# Patient Record
Sex: Female | Born: 1960 | Race: White | Hispanic: No | Marital: Married | Smoking: Never smoker
Health system: Southern US, Community
[De-identification: ages and names within clinical notes are randomized; demographics above are authoritative.]

## PROBLEM LIST (undated history)

## (undated) DIAGNOSIS — J302 Other seasonal allergic rhinitis: Secondary | ICD-10-CM

## (undated) DIAGNOSIS — E785 Hyperlipidemia, unspecified: Secondary | ICD-10-CM

## (undated) DIAGNOSIS — C801 Malignant (primary) neoplasm, unspecified: Secondary | ICD-10-CM

## (undated) DIAGNOSIS — I10 Essential (primary) hypertension: Secondary | ICD-10-CM

## (undated) DIAGNOSIS — K219 Gastro-esophageal reflux disease without esophagitis: Secondary | ICD-10-CM

## (undated) DIAGNOSIS — H11001 Unspecified pterygium of right eye: Secondary | ICD-10-CM

## (undated) HISTORY — PX: ABDOMINAL HYSTERECTOMY: SHX81

## (undated) HISTORY — PX: CHOLECYSTECTOMY: SHX55

## (undated) HISTORY — DX: Essential (primary) hypertension: I10

## (undated) HISTORY — PX: INGUINAL HERNIA REPAIR: SUR1180

## (undated) HISTORY — DX: Gastro-esophageal reflux disease without esophagitis: K21.9

## (undated) HISTORY — DX: Unspecified pterygium of right eye: H11.001

## (undated) HISTORY — DX: Hyperlipidemia, unspecified: E78.5

## (undated) HISTORY — DX: Other seasonal allergic rhinitis: J30.2

---

## 1997-10-01 HISTORY — PX: LAPAROSCOPIC CHOLECYSTECTOMY W/ CHOLANGIOGRAPHY: SUR757

## 2001-10-01 HISTORY — PX: OTHER SURGICAL HISTORY: SHX169

## 2005-02-26 ENCOUNTER — Ambulatory Visit: Payer: Self-pay | Admitting: Internal Medicine

## 2006-05-17 ENCOUNTER — Ambulatory Visit: Payer: Self-pay | Admitting: Internal Medicine

## 2006-06-12 ENCOUNTER — Ambulatory Visit: Payer: Self-pay | Admitting: Gastroenterology

## 2006-06-28 ENCOUNTER — Ambulatory Visit: Payer: Self-pay | Admitting: Gastroenterology

## 2006-07-24 ENCOUNTER — Ambulatory Visit: Payer: Self-pay | Admitting: Gastroenterology

## 2006-07-30 ENCOUNTER — Other Ambulatory Visit: Payer: Self-pay

## 2006-08-05 ENCOUNTER — Ambulatory Visit: Payer: Self-pay | Admitting: Obstetrics and Gynecology

## 2007-07-08 ENCOUNTER — Ambulatory Visit: Payer: Self-pay | Admitting: Internal Medicine

## 2008-06-21 ENCOUNTER — Ambulatory Visit: Payer: Self-pay

## 2008-06-24 ENCOUNTER — Ambulatory Visit: Payer: Self-pay | Admitting: General Practice

## 2008-08-31 ENCOUNTER — Ambulatory Visit: Payer: Self-pay | Admitting: Internal Medicine

## 2008-09-08 ENCOUNTER — Ambulatory Visit: Payer: Self-pay | Admitting: Internal Medicine

## 2008-09-15 ENCOUNTER — Ambulatory Visit: Payer: Self-pay | Admitting: Surgery

## 2008-09-22 ENCOUNTER — Ambulatory Visit: Payer: Self-pay | Admitting: Surgery

## 2008-09-22 DIAGNOSIS — C801 Malignant (primary) neoplasm, unspecified: Secondary | ICD-10-CM

## 2008-09-22 HISTORY — PX: BREAST EXCISIONAL BIOPSY: SUR124

## 2008-09-22 HISTORY — DX: Malignant (primary) neoplasm, unspecified: C80.1

## 2008-10-18 ENCOUNTER — Ambulatory Visit: Payer: Self-pay | Admitting: Surgery

## 2008-11-01 ENCOUNTER — Ambulatory Visit: Payer: Self-pay | Admitting: Oncology

## 2008-11-03 ENCOUNTER — Ambulatory Visit: Payer: Self-pay | Admitting: Oncology

## 2008-11-29 ENCOUNTER — Ambulatory Visit: Payer: Self-pay | Admitting: Oncology

## 2009-01-27 ENCOUNTER — Ambulatory Visit: Payer: Self-pay

## 2009-02-03 ENCOUNTER — Inpatient Hospital Stay: Payer: Self-pay

## 2009-02-03 HISTORY — PX: MASTECTOMY: SHX3

## 2009-02-03 HISTORY — PX: AUGMENTATION MAMMAPLASTY: SUR837

## 2009-03-01 ENCOUNTER — Ambulatory Visit: Payer: Self-pay | Admitting: Oncology

## 2009-03-23 ENCOUNTER — Ambulatory Visit: Payer: Self-pay | Admitting: Oncology

## 2009-03-31 ENCOUNTER — Ambulatory Visit: Payer: Self-pay | Admitting: Oncology

## 2009-08-31 ENCOUNTER — Ambulatory Visit: Payer: Self-pay | Admitting: Oncology

## 2009-09-07 ENCOUNTER — Ambulatory Visit: Payer: Self-pay

## 2009-09-08 ENCOUNTER — Ambulatory Visit: Payer: Self-pay

## 2009-09-19 ENCOUNTER — Ambulatory Visit: Payer: Self-pay | Admitting: Oncology

## 2009-10-01 ENCOUNTER — Ambulatory Visit: Payer: Self-pay | Admitting: Oncology

## 2009-11-17 ENCOUNTER — Ambulatory Visit: Payer: Self-pay | Admitting: Internal Medicine

## 2010-03-01 ENCOUNTER — Ambulatory Visit: Payer: Self-pay | Admitting: Oncology

## 2010-03-22 ENCOUNTER — Ambulatory Visit: Payer: Self-pay | Admitting: Oncology

## 2010-03-31 ENCOUNTER — Ambulatory Visit: Payer: Self-pay | Admitting: Oncology

## 2010-09-22 ENCOUNTER — Ambulatory Visit: Payer: Self-pay | Admitting: Oncology

## 2010-10-01 ENCOUNTER — Ambulatory Visit: Payer: Self-pay | Admitting: Oncology

## 2010-11-22 ENCOUNTER — Ambulatory Visit: Payer: Self-pay | Admitting: Internal Medicine

## 2011-04-02 ENCOUNTER — Ambulatory Visit: Payer: Self-pay | Admitting: Oncology

## 2011-05-02 ENCOUNTER — Ambulatory Visit: Payer: Self-pay | Admitting: Oncology

## 2011-10-02 HISTORY — PX: COLONOSCOPY: SHX174

## 2011-11-27 ENCOUNTER — Ambulatory Visit: Payer: Self-pay | Admitting: Internal Medicine

## 2011-12-24 ENCOUNTER — Ambulatory Visit: Payer: Self-pay | Admitting: Oncology

## 2011-12-24 LAB — COMPREHENSIVE METABOLIC PANEL
Anion Gap: 10 (ref 7–16)
Bilirubin,Total: 0.2 mg/dL (ref 0.2–1.0)
Calcium, Total: 8.9 mg/dL (ref 8.5–10.1)
Chloride: 101 mmol/L (ref 98–107)
Co2: 29 mmol/L (ref 21–32)
Creatinine: 1.04 mg/dL (ref 0.60–1.30)
EGFR (African American): >60
EGFR (Non-African Amer.): 59 — ABNORMAL LOW
Glucose: 110 mg/dL — ABNORMAL HIGH (ref 65–99)
Osmolality: 282 (ref 275–301)
Potassium: 3.4 mmol/L — ABNORMAL LOW (ref 3.5–5.1)
SGOT(AST): 16 U/L (ref 15–37)
SGPT (ALT): 28 U/L

## 2011-12-24 LAB — CBC CANCER CENTER
Basophil #: 0 x10 3/mm (ref 0.0–0.1)
Basophil %: 0.5 %
HGB: 12.9 g/dL (ref 12.0–16.0)
Lymphocyte #: 2.5 x10 3/mm (ref 1.0–3.6)
MCHC: 34.1 g/dL (ref 32.0–36.0)
MCV: 89 fL (ref 80–100)
Monocyte %: 8.2 %
Neutrophil #: 4.4 x10 3/mm (ref 1.4–6.5)
Neutrophil %: 56.7 %
Platelet: 379 x10 3/mm (ref 150–440)
RBC: 4.25 10*6/uL (ref 3.80–5.20)
WBC: 7.8 x10 3/mm (ref 3.6–11.0)

## 2011-12-25 LAB — CANCER ANTIGEN 27.29: CA 27.29: 18.7 U/mL (ref 0.0–38.6)

## 2011-12-31 ENCOUNTER — Ambulatory Visit: Payer: Self-pay | Admitting: Oncology

## 2012-01-30 ENCOUNTER — Ambulatory Visit: Payer: Self-pay | Admitting: Oncology

## 2012-06-25 ENCOUNTER — Ambulatory Visit: Payer: Self-pay | Admitting: Oncology

## 2012-06-25 LAB — CBC CANCER CENTER
Basophil #: 0.1 x10 3/mm (ref 0.0–0.1)
Eosinophil %: 2.3 %
HCT: 37.4 % (ref 35.0–47.0)
HGB: 12.2 g/dL (ref 12.0–16.0)
Lymphocyte #: 2.3 x10 3/mm (ref 1.0–3.6)
MCH: 29.6 pg (ref 26.0–34.0)
MCV: 90 fL (ref 80–100)
Monocyte #: 0.7 x10 3/mm (ref 0.2–0.9)
Platelet: 338 x10 3/mm (ref 150–440)
RDW: 13.1 % (ref 11.5–14.5)
WBC: 8.6 x10 3/mm (ref 3.6–11.0)

## 2012-06-25 LAB — COMPREHENSIVE METABOLIC PANEL
Albumin: 3.9 g/dL (ref 3.4–5.0)
Alkaline Phosphatase: 58 U/L (ref 50–136)
Anion Gap: 11 (ref 7–16)
Bilirubin,Total: 0.2 mg/dL (ref 0.2–1.0)
Calcium, Total: 9.3 mg/dL (ref 8.5–10.1)
Chloride: 99 mmol/L (ref 98–107)
EGFR (African American): 57 — ABNORMAL LOW
Osmolality: 278 (ref 275–301)
Potassium: 3.6 mmol/L (ref 3.5–5.1)
SGOT(AST): 17 U/L (ref 15–37)
SGPT (ALT): 25 U/L (ref 12–78)
Sodium: 138 mmol/L (ref 136–145)

## 2012-06-26 LAB — CANCER ANTIGEN 27.29: CA 27.29: 14.3 U/mL (ref 0.0–38.6)

## 2012-07-01 ENCOUNTER — Ambulatory Visit: Payer: Self-pay | Admitting: Oncology

## 2012-07-25 ENCOUNTER — Ambulatory Visit: Payer: Self-pay | Admitting: Gastroenterology

## 2012-07-28 LAB — PATHOLOGY REPORT

## 2012-08-01 ENCOUNTER — Ambulatory Visit: Payer: Self-pay | Admitting: Oncology

## 2012-09-09 ENCOUNTER — Ambulatory Visit: Payer: Self-pay | Admitting: Anesthesiology

## 2012-09-09 LAB — POTASSIUM: Potassium: 4 mmol/L (ref 3.5–5.1)

## 2012-09-15 ENCOUNTER — Ambulatory Visit: Payer: Self-pay | Admitting: Surgery

## 2012-11-27 ENCOUNTER — Ambulatory Visit: Payer: Self-pay | Admitting: Oncology

## 2012-12-22 ENCOUNTER — Ambulatory Visit: Payer: Self-pay | Admitting: Oncology

## 2013-01-07 ENCOUNTER — Ambulatory Visit: Payer: Self-pay | Admitting: Oncology

## 2013-01-14 LAB — COMPREHENSIVE METABOLIC PANEL
Albumin: 3.3 g/dL — ABNORMAL LOW (ref 3.4–5.0)
Alkaline Phosphatase: 51 U/L (ref 50–136)
Anion Gap: 7 (ref 7–16)
BUN: 12 mg/dL (ref 7–18)
Co2: 27 mmol/L (ref 21–32)
Creatinine: 0.84 mg/dL (ref 0.60–1.30)
EGFR (African American): >60
Glucose: 114 mg/dL — ABNORMAL HIGH (ref 65–99)
Osmolality: 278 (ref 275–301)
SGPT (ALT): 24 U/L (ref 12–78)
Sodium: 139 mmol/L (ref 136–145)

## 2013-01-14 LAB — CBC CANCER CENTER
Basophil #: 0.1 x10 3/mm (ref 0.0–0.1)
Basophil %: 1.2 %
Eosinophil %: 2.1 %
HGB: 12.1 g/dL (ref 12.0–16.0)
Lymphocyte %: 38.6 %
MCH: 29.1 pg (ref 26.0–34.0)
Monocyte #: 0.7 x10 3/mm (ref 0.2–0.9)
Neutrophil %: 49.2 %
Platelet: 353 x10 3/mm (ref 150–440)
RBC: 4.15 10*6/uL (ref 3.80–5.20)
RDW: 13.9 % (ref 11.5–14.5)
WBC: 8 x10 3/mm (ref 3.6–11.0)

## 2013-01-29 ENCOUNTER — Ambulatory Visit: Payer: Self-pay | Admitting: Oncology

## 2013-05-07 ENCOUNTER — Ambulatory Visit: Payer: Self-pay | Admitting: Surgery

## 2013-07-15 ENCOUNTER — Ambulatory Visit: Payer: Self-pay | Admitting: Oncology

## 2013-07-15 LAB — CBC CANCER CENTER
Basophil #: 0.1 10*3/uL
Basophil %: 1.4 %
Eosinophil #: 0.2 10*3/uL
Eosinophil %: 2.6 %
HCT: 36.4 %
HGB: 12.5 g/dL
Lymphocyte %: 25.9 %
Lymphs Abs: 1.9 10*3/uL
MCH: 30.4 pg
MCHC: 34.3 g/dL
MCV: 89 fL
Monocyte #: 0.7 10*3/uL
Monocyte %: 9.3 %
Neutrophil #: 4.4 10*3/uL
Neutrophil %: 60.8 %
Platelet: 352 10*3/uL
RBC: 4.11 10*6/uL
RDW: 13.4 %
WBC: 7.2 10*3/uL

## 2013-07-15 LAB — COMPREHENSIVE METABOLIC PANEL WITH GFR
Albumin: 3.6 g/dL
Alkaline Phosphatase: 64 U/L
Anion Gap: 12
BUN: 14 mg/dL
Bilirubin,Total: 0.3 mg/dL
Calcium, Total: 8.5 mg/dL
Chloride: 101 mmol/L
Co2: 25 mmol/L
Creatinine: 1.11 mg/dL
EGFR (African American): >60
EGFR (Non-African Amer.): 57 — ABNORMAL LOW
Glucose: 174 mg/dL — ABNORMAL HIGH
Osmolality: 280
Potassium: 3.5 mmol/L
SGOT(AST): 18 U/L
SGPT (ALT): 39 U/L
Sodium: 138 mmol/L
Total Protein: 7.2 g/dL

## 2013-07-16 LAB — CANCER ANTIGEN 27.29: CA 27.29: 17.4 U/mL (ref 0.0–38.6)

## 2013-08-01 ENCOUNTER — Ambulatory Visit: Payer: Self-pay | Admitting: Oncology

## 2013-08-17 HISTORY — PX: LAPAROSCOPIC GASTRIC BYPASS: SUR771

## 2013-11-03 DIAGNOSIS — K219 Gastro-esophageal reflux disease without esophagitis: Secondary | ICD-10-CM | POA: Insufficient documentation

## 2013-11-03 DIAGNOSIS — E782 Mixed hyperlipidemia: Secondary | ICD-10-CM | POA: Insufficient documentation

## 2013-11-03 DIAGNOSIS — Z853 Personal history of malignant neoplasm of breast: Secondary | ICD-10-CM | POA: Insufficient documentation

## 2013-11-03 DIAGNOSIS — I10 Essential (primary) hypertension: Secondary | ICD-10-CM | POA: Insufficient documentation

## 2014-01-13 ENCOUNTER — Ambulatory Visit: Payer: Self-pay | Admitting: Oncology

## 2014-01-13 LAB — CBC CANCER CENTER
BASOS ABS: 0.1 x10 3/mm (ref 0.0–0.1)
Basophil %: 1 %
Eosinophil #: 0.3 x10 3/mm (ref 0.0–0.7)
Eosinophil %: 3.8 %
HCT: 40.3 % (ref 35.0–47.0)
HGB: 13.1 g/dL (ref 12.0–16.0)
LYMPHS ABS: 2 x10 3/mm (ref 1.0–3.6)
Lymphocyte %: 29.8 %
MCH: 30.1 pg (ref 26.0–34.0)
MCHC: 32.4 g/dL (ref 32.0–36.0)
MCV: 93 fL (ref 80–100)
Monocyte #: 0.5 x10 3/mm (ref 0.2–0.9)
Monocyte %: 7.6 %
NEUTROS ABS: 3.9 x10 3/mm (ref 1.4–6.5)
NEUTROS PCT: 57.8 %
PLATELETS: 321 x10 3/mm (ref 150–440)
RBC: 4.34 10*6/uL (ref 3.80–5.20)
RDW: 13.6 % (ref 11.5–14.5)
WBC: 6.7 x10 3/mm (ref 3.6–11.0)

## 2014-01-13 LAB — COMPREHENSIVE METABOLIC PANEL
Albumin: 3.7 g/dL (ref 3.4–5.0)
Alkaline Phosphatase: 69 U/L
Anion Gap: 8 (ref 7–16)
BILIRUBIN TOTAL: 0.2 mg/dL (ref 0.2–1.0)
BUN: 20 mg/dL — AB (ref 7–18)
Calcium, Total: 9.7 mg/dL (ref 8.5–10.1)
Chloride: 103 mmol/L (ref 98–107)
Co2: 31 mmol/L (ref 21–32)
Creatinine: 0.74 mg/dL (ref 0.60–1.30)
Glucose: 99 mg/dL (ref 65–99)
Osmolality: 286 (ref 275–301)
POTASSIUM: 4.1 mmol/L (ref 3.5–5.1)
SGOT(AST): 18 U/L (ref 15–37)
SGPT (ALT): 34 U/L (ref 12–78)
Sodium: 142 mmol/L (ref 136–145)
TOTAL PROTEIN: 7.1 g/dL (ref 6.4–8.2)

## 2014-01-29 ENCOUNTER — Ambulatory Visit: Payer: Self-pay | Admitting: Oncology

## 2014-02-05 ENCOUNTER — Ambulatory Visit: Payer: Self-pay | Admitting: Oncology

## 2014-05-11 ENCOUNTER — Ambulatory Visit: Payer: Self-pay | Admitting: Surgery

## 2014-07-15 ENCOUNTER — Ambulatory Visit: Payer: Self-pay | Admitting: Oncology

## 2014-07-15 LAB — CBC CANCER CENTER
BASOS ABS: 0.1 x10 3/mm (ref 0.0–0.1)
Basophil %: 1.1 %
EOS ABS: 0.1 x10 3/mm (ref 0.0–0.7)
Eosinophil %: 1.6 %
HCT: 39.7 % (ref 35.0–47.0)
HGB: 13.1 g/dL (ref 12.0–16.0)
LYMPHS PCT: 32.1 %
Lymphocyte #: 2 x10 3/mm (ref 1.0–3.6)
MCH: 31.1 pg (ref 26.0–34.0)
MCHC: 32.9 g/dL (ref 32.0–36.0)
MCV: 95 fL (ref 80–100)
MONOS PCT: 8.1 %
Monocyte #: 0.5 x10 3/mm (ref 0.2–0.9)
NEUTROS PCT: 57.1 %
Neutrophil #: 3.6 x10 3/mm (ref 1.4–6.5)
Platelet: 319 x10 3/mm (ref 150–440)
RBC: 4.2 10*6/uL (ref 3.80–5.20)
RDW: 13.5 % (ref 11.5–14.5)
WBC: 6.4 x10 3/mm (ref 3.6–11.0)

## 2014-07-15 LAB — COMPREHENSIVE METABOLIC PANEL
ALBUMIN: 3.8 g/dL (ref 3.4–5.0)
ALK PHOS: 75 U/L
ANION GAP: 6 — AB (ref 7–16)
BILIRUBIN TOTAL: 0.3 mg/dL (ref 0.2–1.0)
BUN: 18 mg/dL (ref 7–18)
CALCIUM: 9.1 mg/dL (ref 8.5–10.1)
Chloride: 104 mmol/L (ref 98–107)
Co2: 30 mmol/L (ref 21–32)
Creatinine: 0.81 mg/dL (ref 0.60–1.30)
EGFR (African American): >60
EGFR (Non-African Amer.): >60
GLUCOSE: 80 mg/dL (ref 65–99)
Osmolality: 280 (ref 275–301)
Potassium: 4.4 mmol/L (ref 3.5–5.1)
SGOT(AST): 21 U/L (ref 15–37)
SGPT (ALT): 39 U/L
Sodium: 140 mmol/L (ref 136–145)
Total Protein: 7 g/dL (ref 6.4–8.2)

## 2014-07-16 LAB — CANCER ANTIGEN 27.29: CA 27.29: 5.2 U/mL (ref 0.0–38.6)

## 2014-08-01 ENCOUNTER — Ambulatory Visit: Payer: Self-pay | Admitting: Oncology

## 2015-01-14 ENCOUNTER — Other Ambulatory Visit: Payer: Self-pay | Admitting: Oncology

## 2015-01-14 DIAGNOSIS — Z1231 Encounter for screening mammogram for malignant neoplasm of breast: Secondary | ICD-10-CM

## 2015-01-18 NOTE — Op Note (Signed)
PATIENT NAME:  Jessica Hammond, Jessica Hammond MR#:  [id number] DATE OF BIRTH:  18-Dec-1960  DATE OF PROCEDURE:  09/15/2012  PREOPERATIVE DIAGNOSIS: Right inguinal hernia.   POSTOPERATIVE DIAGNOSIS: Right inguinal hernia.   PROCEDURE: Right inguinal hernia repair.   SURGEON: Rochel Brome, MD   ANESTHESIA: General.   INDICATIONS: This 54 year old female recently has had right lower abdominal pain, CT findings of right inguinal hernia containing small bowel. She had a history of a TRAM flap, and a bulge was demonstrated on physical exam which extended down into the mons pubis.   DESCRIPTION OF PROCEDURE: The patient was placed on the operating table in the supine position under general anesthesia. The right lower abdomen was prepared with ChloraPrep and draped in a sterile manner.   The hernia was reduced.  A transversely-oriented right suprapubic incision was made at the site of the old scar, carried down through subcutaneous tissues. Several small bleeding points were cauterized Scarpa's fascia was incised. There was scarring found. Further dissection was carried down through fatty tissues to encounter a hernia sac. This sac was grasped with a Kelly clamp and dissected away from surrounding tissues. There was old scar, and fascia was identified as the sac was further dissected. The sac itself was approximately 3 inches in length and had a wide base. Next, properitoneal fat was dissected away from the fascia circumferentially. Cooper's ligament was identified. The abdominal wall was thin and there was much scarring found, several sutures. Repair was carried out with use of Atrium mesh, cutting out an Atrium mesh which was circular and approximately 6 cm in diameter. This was sutured to the Cooper's ligament with 0 Surgilon, and also it was sutured circumferentially to the thin fascia of the abdominal wall with through and through 0 Surgilon sutures, placing 6 mm Atrium mesh pledgets on the outer surface of the fascia.  Subsequently, the ring defect, which was approximately 4 cm in diameter, was closed with interrupted 0 Surgilon simple sutures incorporating each suture into the central portion of the mesh. The repair looked good. Hemostasis was intact. The fascia superior and lateral to the repair site was infiltrated with 0.5% Sensorcaine with epinephrine. Subcutaneous tissues were infiltrated as well using a total of 15 mL.  Next, the Scarpa's fascia was closed with interrupted 4-0 Vicryl, and the skin was closed with a running 4-0 Monocryl subcuticular suture and Dermabond. The patient tolerated the procedure satisfactorily and was then prepared for transfer to the recovery room.   ____________________________ Lenna Sciara. Rochel Brome, MD jws:cb D: 09/15/2012 11:15:56 ET T: 09/16/2012 11:25:36 ET JOB#: [id number]  cc: Loreli Dollar, MD, <Dictator> Loreli Dollar MD ELECTRONICALLY SIGNED 09/17/2012 18:14

## 2015-01-22 NOTE — Op Note (Signed)
PATIENT NAME:  Jessica Hammond, Jessica Hammond MR#:  [id number] DATE OF BIRTH:  1960/12/04  DATE OF PROCEDURE:  05/11/2014  PREOPERATIVE DIAGNOSIS: Ventral hernia.   POSTOPERATIVE DIAGNOSIS: Ventral hernia.   PROCEDURE: Ventral hernia repair.   SURGEON: Dr. Rochel Brome.   ANESTHESIA: General.   INDICATIONS: This 54 year old female has a history TRAM flap with harvesting of rectus muscle which was bilateral with insertion of tissue matrix, recently has been having bulging in the left lower quadrant in the suprapubic area, and a hernia was demonstrated on physical exam. It appeared that this hernia was related to her previous surgery and repair was recommended for definitive treatment.   DESCRIPTION OF PROCEDURE: The patient was placed on the operating table in the supine position under general anesthesia. The abdomen was prepared with ChloraPrep and draped in a sterile manner. A transverse incision was made in the old scar in the left lower quadrant extending just lateral to the midline. This incision was carried down through a thin layer of subcutaneous tissues. Several small bleeding points were cauterized. The deep fascia and scar tissue were encountered. Dissection was carried inferiorly from this point just superficial to the fascia and encountered a ventral hernia defect which the defect itself was approximately 1.8 cm in dimension. The sac was dissected free from surrounding structures and was approximately 5 cm in length. The sac was opened. Its continuity within the peritoneal cavity was demonstrated. There was no bowel within the sac. The sac was suture ligated with 0 Surgilon and doubly ligated and amputated, was not sent for pathology. The stump was allowed to retract down underneath the fascial ring defect. It is noted that the fascia and scar tissue in this area was quite thin and elected to use a Bard soft mesh which was cut to create a circular shape of some 5 cm in diameter. This was sutured to the  posterior portion of the pubic tubercle with 0 Surgilon and also was sutured with through and through fascial sutures of 0 Surgilon to spread out the mesh underneath the fascia. Next the fascial ring defect was closed with a longitudinally-oriented suture line of interrupted 0 Surgilon figure-of-8 sutures and simple sutures incorporating mesh into each suture. The repair looked good. Hemostasis was intact. There was a small area of redundant fascia which was imbricated with a running 0 Surgilon suture just beneath the incision. The deep fascia and subcutaneous tissues were infiltrated with 0.5% Sensorcaine with epinephrine. The subcutaneous tissues were also approximated with a running 4-0 Monocryl, and then the skin was closed with a running 4-0 Monocryl subcuticular suture and Dermabond. The patient tolerated surgery satisfactorily and was prepared for transfer to the recovery room.    ____________________________ Lenna Sciara. Rochel Brome, MD jws:lt D: 05/11/2014 14:33:11 ET T: 05/11/2014 16:34:30 ET JOB#: [id number]  cc: Loreli Dollar, MD, <Dictator> Loreli Dollar MD ELECTRONICALLY SIGNED 05/13/2014 18:33

## 2015-02-07 ENCOUNTER — Other Ambulatory Visit: Payer: Self-pay | Admitting: Oncology

## 2015-02-07 ENCOUNTER — Ambulatory Visit
Admission: RE | Admit: 2015-02-07 | Discharge: 2015-02-07 | Disposition: A | Discharge status: Home / Self Care | Payer: BLUE CROSS/BLUE SHIELD | Source: Ambulatory Visit | Attending: Oncology | Admitting: Oncology

## 2015-02-07 DIAGNOSIS — Z1231 Encounter for screening mammogram for malignant neoplasm of breast: Secondary | ICD-10-CM

## 2015-02-07 HISTORY — DX: Malignant (primary) neoplasm, unspecified: C80.1

## 2015-07-18 ENCOUNTER — Other Ambulatory Visit: Payer: Self-pay | Admitting: *Deleted

## 2015-07-18 DIAGNOSIS — C50919 Malignant neoplasm of unspecified site of unspecified female breast: Secondary | ICD-10-CM

## 2015-07-21 ENCOUNTER — Inpatient Hospital Stay: Payer: BLUE CROSS/BLUE SHIELD | Attending: Oncology | Admitting: Oncology

## 2015-07-21 ENCOUNTER — Inpatient Hospital Stay: Payer: BLUE CROSS/BLUE SHIELD

## 2015-07-21 ENCOUNTER — Encounter: Payer: Self-pay | Admitting: Oncology

## 2015-07-21 VITALS — BP 141/90 | HR 63 | Temp 97.7°F | Wt 146.2 lb

## 2015-07-21 DIAGNOSIS — Z79899 Other long term (current) drug therapy: Secondary | ICD-10-CM | POA: Diagnosis not present

## 2015-07-21 DIAGNOSIS — C50919 Malignant neoplasm of unspecified site of unspecified female breast: Secondary | ICD-10-CM

## 2015-07-21 DIAGNOSIS — R030 Elevated blood-pressure reading, without diagnosis of hypertension: Secondary | ICD-10-CM | POA: Diagnosis not present

## 2015-07-21 DIAGNOSIS — Z803 Family history of malignant neoplasm of breast: Secondary | ICD-10-CM | POA: Insufficient documentation

## 2015-07-21 DIAGNOSIS — Z9013 Acquired absence of bilateral breasts and nipples: Secondary | ICD-10-CM | POA: Diagnosis not present

## 2015-07-21 DIAGNOSIS — Z853 Personal history of malignant neoplasm of breast: Secondary | ICD-10-CM | POA: Diagnosis not present

## 2015-07-21 LAB — COMPREHENSIVE METABOLIC PANEL
ALBUMIN: 4.2 g/dL (ref 3.5–5.0)
ALT: 17 U/L (ref 14–54)
AST: 20 U/L (ref 15–41)
Alkaline Phosphatase: 50 U/L (ref 38–126)
Anion gap: 4 — ABNORMAL LOW (ref 5–15)
BUN: 19 mg/dL (ref 6–20)
CHLORIDE: 104 mmol/L (ref 101–111)
CO2: 28 mmol/L (ref 22–32)
CREATININE: 0.76 mg/dL (ref 0.44–1.00)
Calcium: 8.4 mg/dL — ABNORMAL LOW (ref 8.9–10.3)
GFR calc Af Amer: >60 mL/min (ref >60)
GFR calc non Af Amer: >60 mL/min (ref >60)
GLUCOSE: 93 mg/dL (ref 65–99)
POTASSIUM: 4.1 mmol/L (ref 3.5–5.1)
Sodium: 136 mmol/L (ref 135–145)
Total Bilirubin: 0.3 mg/dL (ref 0.3–1.2)
Total Protein: 7.3 g/dL (ref 6.5–8.1)

## 2015-07-21 LAB — CBC WITH DIFFERENTIAL/PLATELET
BASOS PCT: 1 %
Basophils Absolute: 0.1 10*3/uL (ref 0–0.1)
EOS PCT: 2 %
Eosinophils Absolute: 0.1 10*3/uL (ref 0–0.7)
HCT: 38.1 % (ref 35.0–47.0)
Hemoglobin: 12.8 g/dL (ref 12.0–16.0)
LYMPHS PCT: 32 %
Lymphs Abs: 1.8 10*3/uL (ref 1.0–3.6)
MCH: 30.3 pg (ref 26.0–34.0)
MCHC: 33.6 g/dL (ref 32.0–36.0)
MCV: 90.2 fL (ref 80.0–100.0)
MONO ABS: 0.5 10*3/uL (ref 0.2–0.9)
Monocytes Relative: 9 %
Neutro Abs: 3.3 10*3/uL (ref 1.4–6.5)
Neutrophils Relative %: 56 %
PLATELETS: 323 10*3/uL (ref 150–440)
RBC: 4.22 MIL/uL (ref 3.80–5.20)
RDW: 13.1 % (ref 11.5–14.5)
WBC: 5.8 10*3/uL (ref 3.6–11.0)

## 2015-07-21 NOTE — Progress Notes (Signed)
[place] @ [facility] Telephone:(336) [phone]  Fax:([phone]) [place]: [birth year]  MR#: [id number]  OJJ#:[id number]  No care team member to display  CHIEF COMPLAINT:  Chief Complaint  Patient presents with  . OTHER     No history exists.   Chief Complaint/Diagnosis:   1. Abnormal mammogram, followed by lumpectomy and Sentinel lymph node biopsy.  Patient had an abnormal mammogram with microcalcifications in the right upper breast. 2. Biopsy was done on December 23    with needle lLocalization 3. 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 A right partial mastectomy in January 2010. And sentinel lymph node biopsy, negative 5. 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 Patient has finished 2 years of tamoxifen, now would be switched to Aromasin (July, 2012) 7.patient will discontinue Aromasin has has finished   total 5  years aromasin.  (July 15, 2014) 8.BRCA mutation was negative  INTERVAL HISTORY:  Patient is here for ongoing evaluation regarding carcinoma breast.  Patient had a bilateral mastectomy with reconstruction.  No chills.  No fever.  Had a mammogram in May of 2016  REVIEW OF SYSTEMS:   GENERAL:  Feels good.  Active.  No fevers, sweats or weight loss. PERFORMANCE STATUS (ECOG):0 HEENT:  No visual changes, runny nose, sore throat, mouth sores or tenderness. Lungs: No shortness of breath or cough.  No hemoptysis. Cardiac:  No chest pain, palpitations, orthopnea, or PND. GI:  No nausea, vomiting, diarrhea, constipation, melena or hematochezia. GU:  No urgency, frequency, dysuria, or  hematuria. Musculoskeletal:  No back pain.  No joint pain.  No muscle tenderness. Extremities:  No pain or swelling. Skin:  No rashes or skin changes. Neuro:  No headache, numbness or weakness, balance or coordination issues. Endocrine:  No diabetes, thyroid issues, hot flashes or night sweats. Psych:  No mood changes, depression or anxiety. Pain:  No focal pain. Review of systems:  All other systems reviewed and found to be negative. As per HPI. Otherwise, a complete review of systems is negatve.  PAST MEDICAL HISTORY: Past Medical History  Diagnosis Date  . Cancer ([place]) 09/22/2008    rt breast    PAST SURGICAL HISTORY: Past Surgical History  Procedure Laterality Date  . Mastectomy Bilateral 02/03/2009    bilat mast with tramflap recon.  . Augmentation mammaplasty  02/03/2009    tranflap  . Breast excisional biopsy Right 09/22/2008    lumpectomy +    FAMILY HISTORY Family History  Problem Relation Age of Onset  . Breast cancer Mother 35  . Breast cancer Maternal Aunt 67    twin sister to mother    ADVANCED DIRECTIVES:  Patient does not have any living will or healthcare power of attorney.  Information was given .  Available resources had been discussed.  We will follow-up on subsequent appointments regarding this issue  HEALTH MAINTENANCE: Social History  Substance Use Topics  . Smoking status: Never Smoker   . Smokeless tobacco: Not on file  . Alcohol Use: Not on file      No Known Allergies  Current Outpatient Prescriptions  Medication Sig Dispense Refill  . desloratadine (CLARINEX) 5 MG  tablet Take by mouth.    . Multiple Vitamin (MULTI-VITAMINS) TABS Take by mouth.    . sertraline (ZOLOFT) 50 MG tablet Take 50 mg by mouth.     No current facility-administered medications for this visit.    OBJECTIVE:  Filed Vitals:   07/21/15 1205  BP: 141/90  Pulse: 63  Temp: 97.7 F (36.5 C)     There is no height on file to calculate BMI.    ECOG FS:0 -  Asymptomatic  PHYSICAL EXAM: General  status: Performance status is good.  Patient has not lost significant weight. Since last evaluation there is no significant change in the general status HEENT: No evidence of stomatitis. Sclera and conjunctivae :: No jaundice.   pale looking. Lungs: Air  entry equal on both sides.  No rhonchi.  No rales.  Cardiac: Heart sounds are normal.  No pericardial rub.  No murmur. Lymphatic system: Cervical, axillary, inguinal, lymph nodes not palpable GI: Abdomen is soft.liver and spleen not palpable.  No ascites.  Bowel sounds are normal.  No other palpable masses.  No tenderness . Lower extremity: No edema Neurological system: Higher functions, cranial nerves intact no evidence of peripheral neuropathy. Skin: No rash.  No ecchymosis.. No petechial hemorrhages Examination of both breasts: Mastectomy with reconstructive surgery no palpable masses.   LAB RESULTS:  CBC Latest Ref Rng 07/21/2015 07/15/2014  WBC 3.6 - 11.0 K/uL 5.8 6.4  Hemoglobin 12.0 - 16.0 g/dL 12.8 13.1  Hematocrit 35.0 - 47.0 % 38.1 39.7  Platelets 150 - 440 K/uL 323 319    Appointment on 07/21/2015  Component Date Value Ref Range Status  . WBC 07/21/2015 5.8  3.6 - 11.0 K/uL Final  . RBC 07/21/2015 4.22  3.80 - 5.20 MIL/uL Final  . Hemoglobin 07/21/2015 12.8  12.0 - 16.0 g/dL Final  . HCT 07/21/2015 38.1  35.0 - 47.0 % Final  . MCV 07/21/2015 90.2  80.0 - 100.0 fL Final  . MCH 07/21/2015 30.3  26.0 - 34.0 pg Final  . MCHC 07/21/2015 33.6  32.0 - 36.0 g/dL Final  . RDW 07/21/2015 13.1  11.5 - 14.5 % Final  . Platelets 07/21/2015 323  150 - 440 K/uL Final  . Neutrophils Relative % 07/21/2015 56   Final  . Neutro Abs 07/21/2015 3.3  1.4 - 6.5 K/uL Final  . Lymphocytes Relative 07/21/2015 32   Final  . Lymphs Abs 07/21/2015 1.8  1.0 - 3.6 K/uL Final  . Monocytes Relative 07/21/2015 9   Final  . Monocytes Absolute 07/21/2015 0.5  0.2 - 0.9 K/uL Final  . Eosinophils Relative  07/21/2015 2   Final  . Eosinophils Absolute 07/21/2015 0.1  0 - 0.7 K/uL Final  . Basophils Relative 07/21/2015 1   Final  . Basophils Absolute 07/21/2015 0.1  0 - 0.1 K/uL Final  . Sodium 07/21/2015 136  135 - 145 mmol/L Final  . Potassium 07/21/2015 4.1  3.5 - 5.1 mmol/L Final  . Chloride 07/21/2015 104  101 - 111 mmol/L Final  . CO2 07/21/2015 28  22 - 32 mmol/L Final  . Glucose, Bld 07/21/2015 93  65 - 99 mg/dL Final  . BUN 07/21/2015 19  6 - 20 mg/dL Final  . Creatinine, Ser 07/21/2015 0.76  0.44 - 1.00 mg/dL Final  . Calcium 07/21/2015 8.4* 8.9 - 10.3 mg/dL Final  . Total Protein 07/21/2015 7.3  6.5 - 8.1 g/dL Final  . Albumin 07/21/2015 4.2  3.5 - 5.0 g/dL Final  .  AST 07/21/2015 20  15 - 41 U/L Final  . ALT 07/21/2015 17  14 - 54 U/L Final  . Alkaline Phosphatase 07/21/2015 50  38 - 126 U/L Final  . Total Bilirubin 07/21/2015 0.3  0.3 - 1.2 mg/dL Final  . GFR calc non Af Amer 07/21/2015 >60  >60 mL/min Final  . GFR calc Af Amer 07/21/2015 >60  >60 mL/min Final   Comment: (NOTE) The eGFR has been calculated using the CKD EPI equation. This calculation has not been validated in all clinical situations. eGFR's persistently <60 mL/min signify possible Chronic Kidney Disease.   . Anion gap 07/21/2015 4* 5 - 15 Final        ASSESSMENT: Plan:   1. Carcinoma of breast. Status post bilateral mastectomy and reconstruction. Stage 1 disease, with negative lymph node. No evidence of recurrent disease. Small area of likely scar tissue felt at 12 oclock above the breast, no change in size per patient. She reports area has been present since reconstruction with no changes.  ER/ PR positive, on Aromasin and tolerating well. She has routine follow up with Dr. Emily Filbert for GYN evaluation.  Last mammogram in Feb 2014 was negative, [facility] 2. She has her yearly mammogram scheduled for the end of April 2015.  year MEDICAL DECISION MAKING:  Try to get another mammogram done. There is no  evidence of recurrent disease we will continue to follow patient every year All lab data has been reviewed 2.  Patient has slightly elevated diastolic blood pressure and was informed to get reevaluation done with primary care physician  Patient expressed understanding and was in agreement with this plan. She also understands that She can call clinic at any time with any questions, concerns, or complaints.    No matching staging information was found for the patient.  Forest Gleason, MD   07/21/2015 12:24 PM

## 2015-07-24 ENCOUNTER — Encounter: Payer: Self-pay | Admitting: Oncology

## 2015-07-29 ENCOUNTER — Telehealth: Payer: Self-pay | Admitting: *Deleted

## 2015-07-29 NOTE — Telephone Encounter (Signed)
At this time, screening mammogram will not be scheduled due to pt's reconstruction and had a normal breast exam at last MD visit. Instructed pt to call back if has any further questions or if notices abnormality when does self breast exams to set up a mammogram.

## 2016-07-19 ENCOUNTER — Other Ambulatory Visit: Payer: BLUE CROSS/BLUE SHIELD

## 2016-07-19 ENCOUNTER — Ambulatory Visit: Payer: BLUE CROSS/BLUE SHIELD

## 2016-07-20 ENCOUNTER — Other Ambulatory Visit: Payer: Self-pay

## 2016-07-20 ENCOUNTER — Inpatient Hospital Stay: Payer: BLUE CROSS/BLUE SHIELD | Attending: Internal Medicine | Admitting: Internal Medicine

## 2016-07-20 ENCOUNTER — Inpatient Hospital Stay (HOSPITAL_BASED_OUTPATIENT_CLINIC_OR_DEPARTMENT_OTHER): Payer: BLUE CROSS/BLUE SHIELD | Admitting: Internal Medicine

## 2016-07-20 VITALS — BP 163/93 | HR 64 | Temp 97.7°F | Resp 18 | Wt 159.5 lb

## 2016-07-20 DIAGNOSIS — Z853 Personal history of malignant neoplasm of breast: Secondary | ICD-10-CM

## 2016-07-20 DIAGNOSIS — C50919 Malignant neoplasm of unspecified site of unspecified female breast: Secondary | ICD-10-CM

## 2016-07-20 DIAGNOSIS — D509 Iron deficiency anemia, unspecified: Secondary | ICD-10-CM | POA: Insufficient documentation

## 2016-07-20 DIAGNOSIS — Z87891 Personal history of nicotine dependence: Secondary | ICD-10-CM | POA: Diagnosis not present

## 2016-07-20 DIAGNOSIS — Z122 Encounter for screening for malignant neoplasm of respiratory organs: Secondary | ICD-10-CM | POA: Diagnosis not present

## 2016-07-20 DIAGNOSIS — I10 Essential (primary) hypertension: Secondary | ICD-10-CM | POA: Insufficient documentation

## 2016-07-20 DIAGNOSIS — K219 Gastro-esophageal reflux disease without esophagitis: Secondary | ICD-10-CM | POA: Insufficient documentation

## 2016-07-20 DIAGNOSIS — E785 Hyperlipidemia, unspecified: Secondary | ICD-10-CM | POA: Insufficient documentation

## 2016-07-20 DIAGNOSIS — D500 Iron deficiency anemia secondary to blood loss (chronic): Secondary | ICD-10-CM

## 2016-07-20 LAB — COMPREHENSIVE METABOLIC PANEL
ALBUMIN: 4.1 g/dL (ref 3.5–5.0)
ALT: 23 U/L (ref 14–54)
AST: 24 U/L (ref 15–41)
Alkaline Phosphatase: 61 U/L (ref 38–126)
Anion gap: 8 (ref 5–15)
BUN: 20 mg/dL (ref 6–20)
CHLORIDE: 102 mmol/L (ref 101–111)
CO2: 27 mmol/L (ref 22–32)
Calcium: 9.2 mg/dL (ref 8.9–10.3)
Creatinine, Ser: 0.74 mg/dL (ref 0.44–1.00)
GFR calc Af Amer: >60 mL/min (ref >60)
GFR calc non Af Amer: >60 mL/min (ref >60)
GLUCOSE: 93 mg/dL (ref 65–99)
POTASSIUM: 4.3 mmol/L (ref 3.5–5.1)
Sodium: 137 mmol/L (ref 135–145)
Total Bilirubin: 0.5 mg/dL (ref 0.3–1.2)
Total Protein: 7.4 g/dL (ref 6.5–8.1)

## 2016-07-20 LAB — CBC WITH DIFFERENTIAL/PLATELET
BASOS ABS: 0.1 10*3/uL (ref 0–0.1)
BASOS PCT: 1 %
EOS PCT: 3 %
Eosinophils Absolute: 0.2 10*3/uL (ref 0–0.7)
HEMATOCRIT: 32.8 % — AB (ref 35.0–47.0)
Hemoglobin: 10.9 g/dL — ABNORMAL LOW (ref 12.0–16.0)
LYMPHS PCT: 34 %
Lymphs Abs: 2 10*3/uL (ref 1.0–3.6)
MCH: 26.9 pg (ref 26.0–34.0)
MCHC: 33.3 g/dL (ref 32.0–36.0)
MCV: 80.8 fL (ref 80.0–100.0)
Monocytes Absolute: 0.5 10*3/uL (ref 0.2–0.9)
Monocytes Relative: 8 %
NEUTROS ABS: 3.2 10*3/uL (ref 1.4–6.5)
Neutrophils Relative %: 54 %
PLATELETS: 367 10*3/uL (ref 150–440)
RBC: 4.06 MIL/uL (ref 3.80–5.20)
RDW: 14 % (ref 11.5–14.5)
WBC: 6 10*3/uL (ref 3.6–11.0)

## 2016-07-20 LAB — IRON AND TIBC
Iron: 33 ug/dL (ref 28–170)
Saturation Ratios: 7 % — ABNORMAL LOW (ref 10.4–31.8)
TIBC: 505 ug/dL — ABNORMAL HIGH (ref 250–450)
UIBC: 472 ug/dL

## 2016-07-20 LAB — RETICULOCYTES
RBC.: 4.06 MIL/uL (ref 3.80–5.20)
RETIC COUNT ABSOLUTE: 44.7 10*3/uL (ref 19.0–183.0)
RETIC CT PCT: 1.1 % (ref 0.4–3.1)

## 2016-07-20 LAB — FERRITIN: Ferritin: 5 ng/mL — ABNORMAL LOW (ref 11–307)

## 2016-07-20 LAB — VITAMIN B12: VITAMIN B 12: 4249 pg/mL — AB (ref 180–914)

## 2016-07-20 NOTE — Progress Notes (Signed)
Patient is here for follow up, she is doing well no complaints.  

## 2016-07-21 LAB — CANCER ANTIGEN 27.29: CA 27.29: 17.2 U/mL (ref 0.0–38.6)

## 2016-07-24 ENCOUNTER — Ambulatory Visit (INDEPENDENT_AMBULATORY_CARE_PROVIDER_SITE_OTHER): Payer: BLUE CROSS/BLUE SHIELD | Admitting: Gastroenterology

## 2016-07-24 ENCOUNTER — Encounter: Payer: Self-pay | Admitting: Gastroenterology

## 2016-07-24 ENCOUNTER — Other Ambulatory Visit: Payer: Self-pay

## 2016-07-24 VITALS — BP 172/98 | HR 69 | Temp 98.6°F | Ht 59.0 in | Wt 160.0 lb

## 2016-07-24 DIAGNOSIS — D509 Iron deficiency anemia, unspecified: Secondary | ICD-10-CM | POA: Diagnosis not present

## 2016-07-24 LAB — MULTIPLE MYELOMA PANEL, SERUM
ALBUMIN/GLOB SERPL: 1.2 (ref 0.7–1.7)
Albumin SerPl Elph-Mcnc: 3.6 g/dL (ref 2.9–4.4)
Alpha 1: 0.2 g/dL (ref 0.0–0.4)
Alpha2 Glob SerPl Elph-Mcnc: 1 g/dL (ref 0.4–1.0)
B-GLOBULIN SERPL ELPH-MCNC: 1.2 g/dL (ref 0.7–1.3)
GAMMA GLOB SERPL ELPH-MCNC: 0.7 g/dL (ref 0.4–1.8)
GLOBULIN, TOTAL: 3.1 g/dL (ref 2.2–3.9)
IGA: 191 mg/dL (ref 87–352)
IgG (Immunoglobin G), Serum: 687 mg/dL — ABNORMAL LOW (ref 700–1600)
IgM, Serum: 64 mg/dL (ref 26–217)
Total Protein ELP: 6.7 g/dL (ref 6.0–8.5)

## 2016-07-24 MED ORDER — NA SULFATE-K SULFATE-MG SULF 17.5-3.13-1.6 GM/177ML PO SOLN: 1.0000 | ORAL | 0 refills | Status: DC

## 2016-07-24 NOTE — Progress Notes (Signed)
Gastroenterology Consultation  Referring Provider:     Dr Sherrine Maples  Primary Care Physician:  No primary care provider on file. Primary Gastroenterologist:  Dr. [name]  Reason for Consultation:     GI bleed         HPI:   Jessica Hammond is a 55 y.o. y/o female referred for consultation & management of anemia. She has been referred by Dr Sherrine Maples  for a colonoscopy for a drop in Hb from 12 grams to 10.9 over the past 6 months. Iron studies 10/201/17 show an elevated TIBC and a very low iron % saturation of 7 and a ferritin of 5 .  B12 is normal elevated. Retic count is not elevated.   She says she had the lab work as part of yearly labs and was later advised to see [place]. Her breast cancer has been in remission. She denies any blood loss, no blood in urine, no nasal bleeds, no rectal bleeding , no vaginal bleeding. She consumes meat. No GI symptoms. No change in her bowel habits. Last colonoscopy was in 2013 and recalls it was normal. No first degree relates with colon cancer. She says she has had colon polyps in the past. She is not on any oral iron.   She had a Roux en Y gastric bypass in 2014.     Past Medical History:  Diagnosis Date  . Cancer ([place]) 09/22/2008   rt breast  . GERD (gastroesophageal reflux disease)   . Hyperlipidemia   . Hypertension   . Pterygium eye, right   . Seasonal allergies     Past Surgical History:  Procedure Laterality Date  . ABDOMINAL HYSTERECTOMY     still has cervix  . AUGMENTATION MAMMAPLASTY  02/03/2009   tranflap  . BREAST EXCISIONAL BIOPSY Right 09/22/2008   lumpectomy +  . COLONOSCOPY  2013  . INGUINAL HERNIA REPAIR Right   . LAPAROSCOPIC CHOLECYSTECTOMY W/ CHOLANGIOGRAPHY  1999  . LAPAROSCOPIC GASTRIC BYPASS  08/17/2013  . MASTECTOMY Bilateral 02/03/2009   bilat mast with tramflap recon.  . sleep apnea surgery  2003    Prior to Admission medications   Medication Sig Start Date End Date Taking? Authorizing Provider  BIOTIN PO Take  by mouth.   Yes Historical Provider, MD  Calcium Carbonate-Vitamin D (CALCIUM-VITAMIN D3 PO) Take by mouth.   Yes Historical Provider, MD  Cyanocobalamin (VITAMIN B-12) 2500 MCG SUBL Place under the tongue.   Yes Historical Provider, MD  desloratadine (CLARINEX) 5 MG tablet Take by mouth. 05/04/15  Yes Historical Provider, MD  Multiple Vitamin (MULTI-VITAMINS) TABS Take by mouth.   Yes Historical Provider, MD  sertraline (ZOLOFT) 50 MG tablet Take 50 mg by mouth daily.    Yes Historical Provider, MD  VITAMIN E PO Take by mouth.   Yes Historical Provider, MD    Family History  Problem Relation Age of Onset  . Breast cancer Mother 73  . Breast cancer Maternal Aunt 49    twin sister to mother     Social History  Substance Use Topics  . Smoking status: Never Smoker  . Smokeless tobacco: Never Used  . Alcohol use 0.0 oz/week     Comment: occasional consumption    Allergies as of 07/24/2016  . (No Known Allergies)    Review of Systems:    All systems reviewed and negative except where noted in HPI.   Physical Exam:  BP (!) 172/98   Pulse 69   Temp 98.6 F (  37 C) (Oral)   Ht 4\' 11"  (1.499 m)   Wt 160 lb (72.6 kg)   BMI 32.32 kg/m  No LMP recorded. Patient has had a hysterectomy. Psych:  Alert and cooperative. Normal mood and affect. General:   Alert,  Well-developed, well-nourished, pleasant and cooperative in NAD Head:  Normocephalic and atraumatic. Eyes:  Sclera clear, no icterus.   Conjunctiva pink. Ears:  Normal auditory acuity. Nose:  No deformity, discharge, or lesions. Mouth:  No deformity or lesions,oropharynx pink & moist. Neck:  Supple; no masses or thyromegaly. Lungs:  Respirations even and unlabored.  Clear throughout to auscultation.   No wheezes, crackles, or rhonchi. No acute distress. Heart:  Regular rate and rhythm; no murmurs, clicks, rubs, or gallops. Abdomen:  Normal bowel sounds.  No bruits.  Soft, non-tender and non-distended without masses,  hepatosplenomegaly or hernias noted.  No guarding or rebound tenderness.  Msk:  Symmetrical without gross deformities.  Good, equal movement & strength bilaterally. Pulses:  Normal pulses noted. Extremities:  No clubbing or edema.  No cyanosis. Neurologic:  Alert and oriented x3;  grossly normal neurologically. Skin:  Intact without significant lesions or rashes.  No jaundice. Lymph Nodes:  No significant cervical adenopathy. Psych:  Alert and cooperative. Normal mood and affect.  Imaging Studies: No results found.  Assessment and Plan:   Jessica Hammond is a 55 y.o. y/o female has been referred for anemia. Recent labs are suggestive of severe iron deficiency anemia with no overt blood loss.   She does have a history or Roux en Y gastric bypass and is very likely the cause of her iron deficiency anemia due to inadequate iron absorption from absent hydrochloric acid. She would require IV iron to replenish her iron stores.   1. Iron deficiency anemia: I will proceed with an EGD+ colonoscopy and if negative will require a small bowel capsule study. I will also rule out celiac disease and blood loss in her urine.  Dr [name] MD

## 2016-07-27 DIAGNOSIS — D508 Other iron deficiency anemias: Secondary | ICD-10-CM | POA: Insufficient documentation

## 2016-07-31 ENCOUNTER — Encounter: Payer: Self-pay | Admitting: Gastroenterology

## 2016-07-31 NOTE — Telephone Encounter (Signed)
error 

## 2016-08-02 NOTE — Progress Notes (Signed)
Encounter opened in error

## 2016-08-03 ENCOUNTER — Ambulatory Visit: Payer: BLUE CROSS/BLUE SHIELD | Admitting: Anesthesiology

## 2016-08-03 ENCOUNTER — Ambulatory Visit
Admission: RE | Admit: 2016-08-03 | Discharge: 2016-08-03 | Disposition: A | Discharge status: Home / Self Care | Payer: BLUE CROSS/BLUE SHIELD | Source: Ambulatory Visit | Attending: Gastroenterology | Admitting: Gastroenterology

## 2016-08-03 ENCOUNTER — Encounter: Payer: Self-pay | Admitting: *Deleted

## 2016-08-03 ENCOUNTER — Encounter
Admission: RE | Disposition: A | Discharge status: Home / Self Care | Payer: Self-pay | Source: Ambulatory Visit | Attending: Gastroenterology

## 2016-08-03 DIAGNOSIS — E669 Obesity, unspecified: Secondary | ICD-10-CM | POA: Diagnosis not present

## 2016-08-03 DIAGNOSIS — I10 Essential (primary) hypertension: Secondary | ICD-10-CM | POA: Insufficient documentation

## 2016-08-03 DIAGNOSIS — D120 Benign neoplasm of cecum: Secondary | ICD-10-CM | POA: Diagnosis not present

## 2016-08-03 DIAGNOSIS — Z9013 Acquired absence of bilateral breasts and nipples: Secondary | ICD-10-CM | POA: Diagnosis not present

## 2016-08-03 DIAGNOSIS — Z9884 Bariatric surgery status: Secondary | ICD-10-CM | POA: Insufficient documentation

## 2016-08-03 DIAGNOSIS — Z9049 Acquired absence of other specified parts of digestive tract: Secondary | ICD-10-CM | POA: Diagnosis not present

## 2016-08-03 DIAGNOSIS — Z6831 Body mass index (BMI) 31.0-31.9, adult: Secondary | ICD-10-CM | POA: Insufficient documentation

## 2016-08-03 DIAGNOSIS — K6389 Other specified diseases of intestine: Secondary | ICD-10-CM | POA: Diagnosis not present

## 2016-08-03 DIAGNOSIS — Z79899 Other long term (current) drug therapy: Secondary | ICD-10-CM | POA: Diagnosis not present

## 2016-08-03 DIAGNOSIS — K219 Gastro-esophageal reflux disease without esophagitis: Secondary | ICD-10-CM | POA: Insufficient documentation

## 2016-08-03 DIAGNOSIS — D509 Iron deficiency anemia, unspecified: Secondary | ICD-10-CM | POA: Diagnosis not present

## 2016-08-03 DIAGNOSIS — Z853 Personal history of malignant neoplasm of breast: Secondary | ICD-10-CM | POA: Insufficient documentation

## 2016-08-03 HISTORY — PX: COLONOSCOPY WITH PROPOFOL: SHX5780

## 2016-08-03 HISTORY — PX: ESOPHAGOGASTRODUODENOSCOPY (EGD) WITH PROPOFOL: SHX5813

## 2016-08-03 SURGERY — COLONOSCOPY WITH PROPOFOL
Anesthesia: General

## 2016-08-03 MED ORDER — PROPOFOL 10 MG/ML IV BOLUS
INTRAVENOUS | Status: DC | PRN
  Administered 2016-08-03: 60 mg via INTRAVENOUS

## 2016-08-03 MED ORDER — SODIUM CHLORIDE 0.9 % IV SOLN
INTRAVENOUS | Status: DC
  Administered 2016-08-03 (×2): via INTRAVENOUS

## 2016-08-03 MED ORDER — SODIUM CHLORIDE 0.9 % IV SOLN: INTRAVENOUS | Status: DC

## 2016-08-03 MED ORDER — LIDOCAINE HCL (CARDIAC) 20 MG/ML IV SOLN
INTRAVENOUS | Status: DC | PRN
  Administered 2016-08-03: 60 mg via INTRAVENOUS

## 2016-08-03 MED ORDER — MIDAZOLAM HCL 2 MG/2ML IJ SOLN
INTRAMUSCULAR | Status: DC | PRN
  Administered 2016-08-03: 1 mg via INTRAVENOUS

## 2016-08-03 MED ORDER — PROPOFOL 500 MG/50ML IV EMUL
INTRAVENOUS | Status: DC | PRN
  Administered 2016-08-03: 150 ug/kg/min via INTRAVENOUS

## 2016-08-03 NOTE — H&P (Signed)
[name] MD [address], [place], [place] [address] Phone: [phone] Fax : [phone]  Primary Care Physician:  Rusty Aus, MD Primary Gastroenterologist:  Dr. [name]   Pre-Procedure History & Physical: HPI:  Jessica Hammond is a 55 y.o. female is here for an endoscopy and colonoscopy.   Past Medical History:  Diagnosis Date  . Cancer ([place]) 09/22/2008   rt breast  . GERD (gastroesophageal reflux disease)   . Hyperlipidemia   . Hypertension   . Pterygium eye, right   . Seasonal allergies     Past Surgical History:  Procedure Laterality Date  . ABDOMINAL HYSTERECTOMY     still has cervix  . AUGMENTATION MAMMAPLASTY  02/03/2009   tranflap  . BREAST EXCISIONAL BIOPSY Right 09/22/2008   lumpectomy +  . CHOLECYSTECTOMY    . COLONOSCOPY  2013  . INGUINAL HERNIA REPAIR Right   . LAPAROSCOPIC CHOLECYSTECTOMY W/ CHOLANGIOGRAPHY  1999  . LAPAROSCOPIC GASTRIC BYPASS  08/17/2013  . MASTECTOMY Bilateral 02/03/2009   bilat mast with tramflap recon.  . sleep apnea surgery  2003    Prior to Admission medications   Medication Sig Start Date End Date Taking? Authorizing Provider  sertraline (ZOLOFT) 50 MG tablet Take 50 mg by mouth daily.    Yes Historical Provider, MD  BIOTIN PO Take by mouth.    Historical Provider, MD  Calcium Carbonate-Vitamin D (CALCIUM-VITAMIN D3 PO) Take by mouth.    Historical Provider, MD  Cyanocobalamin (VITAMIN B-12) 2500 MCG SUBL Place under the tongue.    Historical Provider, MD  desloratadine (CLARINEX) 5 MG tablet Take by mouth. 05/04/15   Historical Provider, MD  Multiple Vitamin (MULTI-VITAMINS) TABS Take by mouth.    Historical Provider, MD  Na Sulfate-K Sulfate-Mg Sulf (SUPREP BOWEL PREP KIT) 17.5-3.13-1.6 GM/180ML SOLN Take 1 kit by mouth as directed. 07/24/16   Lucilla Lame, MD  VITAMIN E PO Take by mouth.    Historical Provider, MD    Allergies as of 07/24/2016  . (No Known Allergies)    Family History  Problem Relation Age  of Onset  . Breast cancer Mother 45  . Breast cancer Maternal Aunt 28    twin sister to mother    Social History   Social History  . Marital status: Married    Spouse name: N/A  . Number of children: N/A  . Years of education: N/A   Occupational History  . Not on file.   Social History Main Topics  . Smoking status: Never Smoker  . Smokeless tobacco: Never Used  . Alcohol use 0.0 oz/week     Comment: occasional consumption  . Drug use: No  . Sexual activity: Not on file   Other Topics Concern  . Not on file   Social History Narrative  . No narrative on file    Review of Systems: See HPI, otherwise negative ROS  Physical Exam: BP (!) 180/86   Pulse 68   Temp 98.2 F (36.8 C) (Tympanic)   Resp 16   Ht _0  (1.499 m)   Wt 156 lb (70.8 kg)   SpO2 100%   BMI 31.51 kg/m  General:   Alert,  pleasant and cooperative in NAD Head:  Normocephalic and atraumatic. Neck:  Supple; no masses or thyromegaly. Lungs:  Clear throughout to auscultation.    Heart:  Regular rate and rhythm. Abdomen:  Soft, nontender and nondistended. Normal bowel sounds, without guarding, and without rebound.   Neurologic:  Alert  and  oriented x4;  grossly normal neurologically.  Impression/Plan: Jessica Hammond is here for an endoscopy and colonoscopy to be performed for iron deficiency anemia   Risks, benefits, limitations, and alternatives regarding  endoscopy and colonoscopy have been reviewed with the patient.  Questions have been answered.  All parties agreeable.   [name], MD  08/03/2016, 9:14 AM

## 2016-08-03 NOTE — Op Note (Signed)
[facility] Patient Name: Jessica Hammond Procedure Date: 08/03/2016 9:19 AM MRN: [id number] Account #: [id number] Date of Birth: December 27, 1960 Admit Type: Ambulatory Age: 55 Room: [facility] ENDO ROOM 1 Gender: Female Note Status: Finalized Procedure:            Upper GI endoscopy Indications:          Iron deficiency anemia Providers:            [name], MD Referring MD:         Rusty Aus, MD (Referring MD) Medicines:            Monitored Anesthesia Care Complications:        No immediate complications. Procedure:            Pre-Anesthesia Assessment:                       - Prior to the procedure, a History and Physical was                        performed, and patient medications, allergies and                        sensitivities were reviewed. The patient's tolerance of                        previous anesthesia was reviewed.                       - ASA Grade Assessment: II - A patient with mild                        systemic disease.                       After obtaining informed consent, the endoscope was                        passed under direct vision. Throughout the procedure,                        the patient's blood pressure, pulse, and oxygen                        saturations were monitored continuously. The Endoscope                        was introduced through the mouth, and advanced to the                        jejunum. The upper GI endoscopy was accomplished with                        ease. Findings:      The esophagus was normal.      The stomach was normal.      The examined jejunum was normal. This was biopsied with a cold forceps       for evaluation of celiac disease.      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 No ulcers, strictures of       bleeding seen. Impression:           -  Normal esophagus.                       - Normal stomach.                       - Normal examined jejunum. Biopsied. Recommendation:        - Resume previous diet today.                       - Return to my office as previously scheduled. Procedure Code(s):    --- Professional ---                       [phone], Esophagogastroduodenoscopy, flexible, transoral;                        with biopsy, single or multiple Diagnosis Code(s):    --- Professional ---                       D50.9, Iron deficiency anemia, unspecified CPT copyright 2016 American Medical Association. All rights reserved. The codes documented in this report are preliminary and upon coder review may  be revised to meet current compliance requirements. [name], MD [name] MD, MD 08/03/2016 9:35:34 AM This report has been signed electronically. Number of Addenda: 0 Note Initiated On: 08/03/2016 9:19 AM      [facility]

## 2016-08-03 NOTE — Transfer of Care (Signed)
Immediate Anesthesia Transfer of Care Note  Patient: Jessica Hammond  Procedure(s) Performed: Procedure(s): COLONOSCOPY WITH PROPOFOL (N/A) ESOPHAGOGASTRODUODENOSCOPY (EGD) WITH PROPOFOL (N/A)  Patient Location: Endoscopy Unit  Anesthesia Type:General  Level of Consciousness: awake, alert , oriented and patient cooperative  Airway & Oxygen Therapy: Patient Spontanous Breathing and Patient connected to nasal cannula oxygen  Post-op Assessment: Report given to RN, Post -op Vital signs reviewed and stable and Patient moving all extremities X 4  Post vital signs: Reviewed and stable  Last Vitals:  Vitals:   08/03/16 0836  BP: (!) 180/86  Pulse: 68  Resp: 16  Temp: 36.8 C    Last Pain:  Vitals:   08/03/16 0836  TempSrc: Tympanic         Complications: No apparent anesthesia complications

## 2016-08-03 NOTE — OR Nursing (Signed)
Due to data mafunction vital signs wer lost. IT attempting to recover.

## 2016-08-03 NOTE — Op Note (Signed)
[facility] Patient Name: Jessica Hammond Procedure Date: 08/03/2016 9:21 AM MRN: [id number] Account #: [id number] Date of Birth: 10-28-1960 Admit Type: Ambulatory Age: 55 Room: [facility] ENDO ROOM 1 Gender: Female Note Status: Finalized Procedure:            Colonoscopy Indications:          Iron deficiency anemia Providers:            [name] MD, MD, Carmelina Dane. Alexander MD, MD Medicines:            Monitored Anesthesia Care Complications:        No immediate complications. Procedure:            Pre-Anesthesia Assessment:                       - Prior to the procedure, a History and Physical was                        performed, and patient medications, allergies and                        sensitivities were reviewed. The patient's tolerance of                        previous anesthesia was reviewed.                       - ASA Grade Assessment: II - A patient with mild                        systemic disease.                       After obtaining informed consent, the colonoscope was                        passed under direct vision. Throughout the procedure,                        the patient's blood pressure, pulse, and oxygen                        saturations were monitored continuously. The Olympus                        CF-Q160AL colonoscope (S#. [phone]) was introduced                        through the anus and advanced to the the cecum,                        identified by the appendiceal orifice, IC valve and                        transillumination. The colonoscopy was performed                        without difficulty. The patient tolerated the procedure                        well. The quality of the bowel preparation was  excellent. Findings:      The perianal and digital rectal examinations were normal.      The entire examined colon appeared normal on direct and retroflexion       views.      A 3 mm polyp was  found in the cecum. The polyp was sessile. The polyp       was removed with a cold biopsy forceps. Resection and retrieval were       complete. Impression:           - The entire examined colon is normal on direct and                        retroflexion views.                       - One 3 mm polyp in the cecum, removed with a cold                        biopsy forceps. Resected and retrieved. Recommendation:       - Await pathology results.                       - Repeat colonoscopy for surveillance based on                        pathology results. Procedure Code(s):    --- Professional ---                       [phone], Colonoscopy, flexible; with biopsy, single or                        multiple Diagnosis Code(s):    --- Professional ---                       D12.0, Benign neoplasm of cecum                       D50.9, Iron deficiency anemia, unspecified CPT copyright 2016 American Medical Association. All rights reserved. The codes documented in this report are preliminary and upon coder review may  be revised to meet current compliance requirements. [name], MD [name] MD, MD 08/03/2016 9:58:12 AM This report has been signed electronically. Gayland Curry MD, MD Number of Addenda: 0 Note Initiated On: 08/03/2016 9:21 AM Scope Withdrawal Time: 0 hours 11 minutes 15 seconds  Total Procedure Duration: 0 hours 17 minutes 46 seconds       [facility]

## 2016-08-03 NOTE — Anesthesia Postprocedure Evaluation (Signed)
Anesthesia Post Note  Patient: Jessica Hammond  Procedure(s) Performed: Procedure(s) (LRB): COLONOSCOPY WITH PROPOFOL (N/A) ESOPHAGOGASTRODUODENOSCOPY (EGD) WITH PROPOFOL (N/A)  Patient location during evaluation: PACU Anesthesia Type: General Level of consciousness: awake Pain management: pain level controlled Vital Signs Assessment: post-procedure vital signs reviewed and stable Respiratory status: spontaneous breathing Cardiovascular status: stable Anesthetic complications: no    Last Vitals:  Vitals:   08/03/16 0836  BP: (!) 180/86  Pulse: 68  Resp: 16  Temp: 36.8 C    Last Pain:  Vitals:   08/03/16 0836  TempSrc: Tympanic                 VAN STAVEREN,[name]

## 2016-08-03 NOTE — Anesthesia Preprocedure Evaluation (Addendum)
Anesthesia Evaluation  Patient identified by MRN, date of birth, ID band Patient awake    Reviewed: Allergy & Precautions, NPO status , Patient's Chart, lab work & pertinent test results  Airway Mallampati: III       Dental  (+) Teeth Intact   Pulmonary neg pulmonary ROS,    breath sounds clear to auscultation       Cardiovascular Exercise Tolerance: Good hypertension, Pt. on medications  Rhythm:Regular Rate:Normal     Neuro/Psych negative neurological ROS  negative psych ROS   GI/Hepatic Neg liver ROS, GERD  Medicated,  Endo/Other  negative endocrine ROSMorbid obesity  Renal/GU negative Renal ROS     Musculoskeletal   Abdominal (+) + obese,   Peds  Hematology  (+) anemia ,   Anesthesia Other Findings   Reproductive/Obstetrics                            Anesthesia Physical Anesthesia Plan  ASA: II  Anesthesia Plan: General   Post-op Pain Management:    Induction: Intravenous  Airway Management Planned: Natural Airway and Nasal Cannula  Additional Equipment:   Intra-op Plan:   Post-operative Plan:   Informed Consent: I have reviewed the patients History and Physical, chart, labs and discussed the procedure including the risks, benefits and alternatives for the proposed anesthesia with the patient or authorized representative who has indicated his/her understanding and acceptance.     Plan Discussed with: CRNA  Anesthesia Plan Comments:         Anesthesia Quick Evaluation

## 2016-08-06 ENCOUNTER — Encounter: Payer: Self-pay | Admitting: Gastroenterology

## 2016-08-06 LAB — SURGICAL PATHOLOGY

## 2016-08-07 ENCOUNTER — Telehealth: Payer: Self-pay

## 2016-08-07 NOTE — Telephone Encounter (Signed)
-----   Message from [name], MD sent at 08/07/2016 10:09 AM EST ----- Normal jejunal and cecal biopsy 1. Suggest colonoscopy in 5 years in view of prior polyps 2. Capsule study to evaluate small bowel for anemia  3. Follow up after

## 2016-08-07 NOTE — Telephone Encounter (Signed)
Pt notified of procedure results. Pt's health maintenance has been updated and added to recall list.  Pt is scheduled for the Capsule Study on Tuesday, Nove 14th at [place]. Once study is complete pt will call back to schedule follow up to discuss results.

## 2016-08-10 ENCOUNTER — Telehealth: Payer: Self-pay

## 2016-08-10 NOTE — Telephone Encounter (Signed)
I put in authorization at [id number]. Diagnosis Code: D64.9 CPT [id number]  I need to fax clinical documentation to [id number] Policy ID # as reference Att to Mrs. Larita Fife  I spoke with  Milderd Meager    I faxed over the EGD and colonoscopy findings from 08/03/16 and the clinical office visit note from 07/24/2016.  Awaiting for an approval

## 2016-08-14 ENCOUNTER — Encounter: Payer: Self-pay | Admitting: Anesthesiology

## 2016-08-14 ENCOUNTER — Ambulatory Visit
Admission: RE | Admit: 2016-08-14 | Discharge: 2016-08-14 | Disposition: A | Discharge status: Home / Self Care | Payer: BLUE CROSS/BLUE SHIELD | Source: Ambulatory Visit | Attending: Gastroenterology | Admitting: Gastroenterology

## 2016-08-14 ENCOUNTER — Encounter
Admission: RE | Disposition: A | Discharge status: Home / Self Care | Payer: Self-pay | Source: Ambulatory Visit | Attending: Gastroenterology

## 2016-08-14 DIAGNOSIS — Z9013 Acquired absence of bilateral breasts and nipples: Secondary | ICD-10-CM | POA: Insufficient documentation

## 2016-08-14 DIAGNOSIS — Z9884 Bariatric surgery status: Secondary | ICD-10-CM | POA: Diagnosis not present

## 2016-08-14 DIAGNOSIS — Z853 Personal history of malignant neoplasm of breast: Secondary | ICD-10-CM | POA: Diagnosis not present

## 2016-08-14 DIAGNOSIS — D509 Iron deficiency anemia, unspecified: Secondary | ICD-10-CM | POA: Insufficient documentation

## 2016-08-14 DIAGNOSIS — Z79899 Other long term (current) drug therapy: Secondary | ICD-10-CM | POA: Diagnosis not present

## 2016-08-14 DIAGNOSIS — I10 Essential (primary) hypertension: Secondary | ICD-10-CM | POA: Diagnosis not present

## 2016-08-14 HISTORY — PX: GIVENS CAPSULE STUDY: SHX5432

## 2016-08-14 SURGERY — IMAGING PROCEDURE, GI TRACT, INTRALUMINAL, VIA CAPSULE

## 2016-08-14 NOTE — Telephone Encounter (Signed)
I have made several attempts and have called different numbers to try and get a hold of an agent to see what is going on with this authorization. I have yet to succeed. Will try again another time

## 2016-08-15 ENCOUNTER — Encounter: Payer: Self-pay | Admitting: Gastroenterology

## 2016-08-15 NOTE — Telephone Encounter (Signed)
Call [place] at [phone] when you get authorization

## 2016-08-16 DIAGNOSIS — Z9884 Bariatric surgery status: Secondary | ICD-10-CM | POA: Insufficient documentation

## 2016-08-16 NOTE — Telephone Encounter (Signed)
Authorization has been received from [place] at [phone].  Authorization # is [id number] I have called [employer] and left her a voicemail.

## 2016-08-17 ENCOUNTER — Inpatient Hospital Stay (HOSPITAL_BASED_OUTPATIENT_CLINIC_OR_DEPARTMENT_OTHER): Payer: BLUE CROSS/BLUE SHIELD | Admitting: Internal Medicine

## 2016-08-17 ENCOUNTER — Inpatient Hospital Stay: Payer: BLUE CROSS/BLUE SHIELD | Attending: Internal Medicine

## 2016-08-17 VITALS — BP 152/89 | HR 61 | Temp 99.1°F | Wt 160.3 lb

## 2016-08-17 DIAGNOSIS — K219 Gastro-esophageal reflux disease without esophagitis: Secondary | ICD-10-CM

## 2016-08-17 DIAGNOSIS — D120 Benign neoplasm of cecum: Secondary | ICD-10-CM | POA: Insufficient documentation

## 2016-08-17 DIAGNOSIS — Z9013 Acquired absence of bilateral breasts and nipples: Secondary | ICD-10-CM | POA: Insufficient documentation

## 2016-08-17 DIAGNOSIS — Z170 Estrogen receptor positive status [ER+]: Secondary | ICD-10-CM

## 2016-08-17 DIAGNOSIS — Z853 Personal history of malignant neoplasm of breast: Secondary | ICD-10-CM

## 2016-08-17 DIAGNOSIS — D509 Iron deficiency anemia, unspecified: Secondary | ICD-10-CM | POA: Diagnosis present

## 2016-08-17 DIAGNOSIS — Z9884 Bariatric surgery status: Secondary | ICD-10-CM | POA: Insufficient documentation

## 2016-08-17 DIAGNOSIS — Z803 Family history of malignant neoplasm of breast: Secondary | ICD-10-CM | POA: Insufficient documentation

## 2016-08-17 DIAGNOSIS — E785 Hyperlipidemia, unspecified: Secondary | ICD-10-CM | POA: Insufficient documentation

## 2016-08-17 DIAGNOSIS — E7801 Familial hypercholesterolemia: Secondary | ICD-10-CM | POA: Insufficient documentation

## 2016-08-17 DIAGNOSIS — C50919 Malignant neoplasm of unspecified site of unspecified female breast: Secondary | ICD-10-CM

## 2016-08-17 DIAGNOSIS — I10 Essential (primary) hypertension: Secondary | ICD-10-CM

## 2016-08-17 LAB — CBC WITH DIFFERENTIAL/PLATELET
BASOS ABS: 0.1 10*3/uL (ref 0–0.1)
BASOS PCT: 1 %
EOS ABS: 0.2 10*3/uL (ref 0–0.7)
Eosinophils Relative: 3 %
HEMATOCRIT: 31.9 % — AB (ref 35.0–47.0)
Hemoglobin: 10.5 g/dL — ABNORMAL LOW (ref 12.0–16.0)
Lymphocytes Relative: 32 %
Lymphs Abs: 1.9 10*3/uL (ref 1.0–3.6)
MCH: 26.7 pg (ref 26.0–34.0)
MCHC: 33 g/dL (ref 32.0–36.0)
MCV: 80.9 fL (ref 80.0–100.0)
MONO ABS: 0.6 10*3/uL (ref 0.2–0.9)
MONOS PCT: 10 %
NEUTROS ABS: 3.4 10*3/uL (ref 1.4–6.5)
NEUTROS PCT: 54 %
Platelets: 359 10*3/uL (ref 150–440)
RBC: 3.94 MIL/uL (ref 3.80–5.20)
RDW: 14.3 % (ref 11.5–14.5)
WBC: 6.2 10*3/uL (ref 3.6–11.0)

## 2016-08-17 NOTE — Progress Notes (Signed)
Jessica Hammond:  1. Carcinoma of breast. Status post bilateral mastectomy and reconstruction. Stage 1 disease, with negative lymph node. No evidence of recurrent disease. Small area of likely scar tissue felt at 12 oclock above the breast, no change in size per patient. She reports area has been present since reconstruction with no changes.  ER/ PR positive, on Aromasin and tolerating well. She has routine follow Hammond with Dr. Emily Filbert for GYN evaluation.  Last mammogram in Feb 2014 was negative, [facility] 2. She has her yearly mammogram scheduled for the end of April 2015.   Chief Complaint: Patient is here for ongoing evaluation regarding carcinoma breast.    HPI: 1. Abnormal mammogram, followed by lumpectomy and Sentinel lymph node biopsy.  Patient had an abnormal mammogram with microcalcifications in the right upper breast. 2. Biopsy was done on December 23    with needle lLocalization 3. 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 A right partial mastectomy in January 2010. And sentinel lymph node biopsy, negative 5. 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 Patient has finished 2 years of tamoxifen, now would be switched to Aromasin (July, 2012) 7.patient will discontinue Aromasin has has finished   total 5  years aromasin.  (July 15, 2014) 8.BRCA mutation was negative   Patient had a bilateral mastectomy with reconstruction.   INTERVAL HISTORY:  She feels well, mastectomy scars are healed well, no bleeding or open wounds or swelling or pain No excess fatigue, no melena, no change in bowel habits, has some heart  burn- chronic No new bone pains    Patient Active Problem List   Diagnosis Date Noted  . Benign neoplasm of cecum   . Iron deficiency anemia 07/27/2016  . Hyperlipidemia   . Hypertension   . GERD (gastroesophageal reflux disease)   . Benign essential HTN 11/03/2013  . Acid reflux 11/03/2013  . Combined fat and carbohydrate induced hyperlipemia 11/03/2013  . H/O malignant neoplasm of breast 11/03/2013    has No Known Allergies.  MEDICAL HISTORY: Past Medical History:  Diagnosis Date  . Cancer ([place]) 09/22/2008   rt breast  . GERD (gastroesophageal reflux disease)   . Hyperlipidemia   . Hypertension   . Pterygium eye, right   . Seasonal allergies     SURGICAL HISTORY: Past Surgical History:  Procedure Laterality Date  . ABDOMINAL HYSTERECTOMY     still has cervix  . AUGMENTATION MAMMAPLASTY  02/03/2009   tranflap  . BREAST EXCISIONAL BIOPSY Right 09/22/2008   lumpectomy +  . CHOLECYSTECTOMY    . COLONOSCOPY  2013  . COLONOSCOPY WITH PROPOFOL N/A 08/03/2016   Procedure: COLONOSCOPY WITH PROPOFOL;  Surgeon: [name], MD;  Location: [facility] ENDOSCOPY;  Service: Endoscopy;  Laterality: N/A;  . ESOPHAGOGASTRODUODENOSCOPY (EGD) WITH PROPOFOL N/A 08/03/2016   Procedure: ESOPHAGOGASTRODUODENOSCOPY (EGD) WITH PROPOFOL;  Surgeon: [name], MD;  Location: [facility] ENDOSCOPY;  Service: Endoscopy;  Laterality: N/A;  . GIVENS CAPSULE STUDY N/A 08/14/2016   Procedure: GIVENS CAPSULE STUDY;  Surgeon: [name], MD;  Location: [facility] ENDOSCOPY;  Service: Gastroenterology;  Laterality: N/A;  . INGUINAL HERNIA REPAIR Right   . LAPAROSCOPIC CHOLECYSTECTOMY W/ CHOLANGIOGRAPHY  [place] [facility]STRIC BYPASS  08/17/2013  . MASTECTOMY Bilateral 02/03/2009   bilat mast with tramflap recon.  . sleep apnea surgery  2003    SOCIAL HISTORY: Social History   Social History  . Marital status: Married    Spouse name: N/A  . Number of children: N/A  . Years of education: N/A   Occupational  History  . Not on file.   Social History Main Topics  . Smoking status: Never Smoker  . Smokeless tobacco: Never Used  . Alcohol use 0.0 oz/week     Comment: occasional consumption  . Drug use: No  . Sexual activity: Not on file   Other Topics Concern  . Not on file   Social History Narrative  . No narrative on file    FAMILY HISTORY: Family History  Problem Relation Age of Onset  . Breast cancer Mother 40  . Breast cancer Maternal Aunt 11    twin sister to mother    Review of Systems  Constitutional: Negative.  Negative for appetite change, chills and diaphoresis.  HENT:  Negative.   Eyes: Negative.   Respiratory: Negative.   Cardiovascular: Negative.   Gastrointestinal: Negative.   Endocrine: Negative.   Genitourinary: Negative.    Musculoskeletal: Negative.   Skin: Negative.   Neurological: Negative.   Hematological: Negative.   Psychiatric/Behavioral: Negative.   All other systems reviewed and are negative.     PHYSICAL EXAMINATION  ECOG PERFORMANCE STATUS: 0 - Asymptomatic  Vitals:   07/20/16 1028  BP: (!) 163/93  Pulse: 64  Resp: 18  Temp: 97.7 F (36.5 C)    Physical Exam  Constitutional: She is oriented to person, place, and time and well-developed, well-nourished, and in no distress.  HENT:  Head: Normocephalic and atraumatic.  Eyes: EOM are normal. Pupils are equal, round, and reactive to light.  Neck: Normal range of motion. Neck supple. No tracheal deviation present. No thyromegaly present.  Cardiovascular: Normal rate and regular rhythm.   Pulmonary/Chest: Effort normal and breath sounds normal.  Bilateral mastectomy incisions and reconstructed breasts have healed well , no signs of recurrence , no palpable axillary or cervical or supraclavicular lymph nodes.  Abdominal: Soft. Bowel sounds are normal.  Musculoskeletal: Normal range of motion.  Neurological: She is alert and oriented to person, place, and time.  Skin: Skin is warm. No  pallor.  Psychiatric: Affect and judgment normal.    LABORATORY DATA:  CBC    Component Value Date/Time   WBC 6.0 07/20/2016 1008   RBC 4.06 07/20/2016 1110   RBC 4.06 07/20/2016 1008   HGB 10.9 (L) 07/20/2016 1008   HGB 13.1 07/15/2014 1113   HCT 32.8 (L) 07/20/2016 1008   HCT 39.7 07/15/2014 1113   PLT 367 07/20/2016 1008   PLT 319 07/15/2014 1113   MCV 80.8 07/20/2016 1008   MCV 95 07/15/2014 1113   MCH 26.9 07/20/2016 1008   MCHC 33.3 07/20/2016 1008   RDW 14.0 07/20/2016 1008   RDW 13.5 07/15/2014 1113   LYMPHSABS 2.0 07/20/2016 1008   LYMPHSABS 2.0 07/15/2014 1113   MONOABS 0.5 07/20/2016 1008   MONOABS 0.5 07/15/2014 1113   EOSABS 0.2 07/20/2016 1008   EOSABS 0.1 07/15/2014 1113   BASOSABS 0.1 07/20/2016 1008   BASOSABS 0.1 07/15/2014 1113    CMP     Component Value Date/Time   NA 137 07/20/2016 1008   NA 140 07/15/2014 1113   K 4.3 07/20/2016 1008   K  4.4 07/15/2014 1113   CL 102 07/20/2016 1008   CL 104 07/15/2014 1113   CO2 27 07/20/2016 1008   CO2 30 07/15/2014 1113   GLUCOSE 93 07/20/2016 1008   GLUCOSE 80 07/15/2014 1113   BUN 20 07/20/2016 1008   BUN 18 07/15/2014 1113   CREATININE 0.74 07/20/2016 1008   CREATININE 0.81 07/15/2014 1113   CALCIUM 9.2 07/20/2016 1008   CALCIUM 9.1 07/15/2014 1113   PROT 7.4 07/20/2016 1008   PROT 7.0 07/15/2014 1113   ALBUMIN 4.1 07/20/2016 1008   ALBUMIN 3.8 07/15/2014 1113   AST 24 07/20/2016 1008   AST 21 07/15/2014 1113   ALT 23 07/20/2016 1008   ALT 39 07/15/2014 1113   ALKPHOS 61 07/20/2016 1008   ALKPHOS 75 07/15/2014 1113   BILITOT 0.5 07/20/2016 1008   BILITOT 0.3 07/15/2014 1113   GFRNONAA >60 07/20/2016 1008   GFRNONAA >60 07/15/2014 1113   GFRNONAA >60 01/13/2014 1418   GFRAA >60 07/20/2016 1008   GFRAA >60 07/15/2014 1113   GFRAA >60 01/13/2014 1418   Assessment and Plan:  Breast cancer  Stage I s/p bilateral mastectomy 2010, now reconstructed Completed Five years of Aromasin as of  October 2015 Doing well, no signs of recurrence  New onset anemia noted,. Iron panel adde on to labs today show iron deficiency. I will arrange for a GI work Hammond and begin oral iron supplements ( Note, these recommendations were given to her by phone after she left the office)    Orders Placed This Encounter  Procedures  . Ferritin    Standing Status:   Future    Number of Occurrences:   1    Standing Expiration Date:   07/20/2017  . Iron and TIBC    Standing Status:   Future    Number of Occurrences:   1    Standing Expiration Date:   07/20/2017  . Vitamin B12    Standing Status:   Future    Number of Occurrences:   1    Standing Expiration Date:   07/20/2017  . Reticulocytes    Standing Status:   Future    Number of Occurrences:   1    Standing Expiration Date:   07/20/2017  . Cancer antigen 27.29    Standing Status:   Future    Number of Occurrences:   1    Standing Expiration Date:   07/20/2017  . Multiple Myeloma Panel (SPEP&IFE w/QIG)    Standing Status:   Future    Number of Occurrences:   1    Standing Expiration Date:   07/20/2017  . Ambulatory referral to Gastroenterology    Referral Priority:   Routine    Referral Type:   Consultation    Referral Reason:   Specialty Services Required    Referred to Provider:   [name], MD    Number of Visits Requested:   1    All questions were answered. The patient knows to call the clinic with any problems, questions or concerns. We can certainly see the patient much sooner if necessary. This note was electronically signed. Creola Corn, MD 08/17/2016

## 2016-08-17 NOTE — Progress Notes (Deleted)
[place] @ [facility] [phone]  Fax:(336) [place]: 1961-08-10  MR#: [id number]  EXH#:[id number]  Patient Care Team: Rusty Aus, MD as PCP - General (Internal Medicine)  CHIEF COMPLAINT:  No chief complaint on file.    No history exists.   Chief Complaint/Diagnosis:   1. Abnormal mammogram, followed by lumpectomy and Sentinel lymph node biopsy.  Patient had an abnormal mammogram with microcalcifications in the right upper breast. 2. Biopsy was done on December 23    with needle lLocalization 3. 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 A right partial mastectomy in January 2010. And sentinel lymph node biopsy, negative 5. 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 Patient has finished 2 years of tamoxifen, now would be switched to Aromasin (July, 2012) 7.patient will discontinue Aromasin has has finished   total 5  years aromasin.  (July 15, 2014) 8.BRCA mutation was negative  INTERVAL HISTORY:  Patient is here for ongoing evaluation regarding carcinoma breast.  Patient had a bilateral mastectomy with reconstruction.  No chills.  No fever.  Had a mammogram in May of 2016  REVIEW OF SYSTEMS:   GENERAL:  Feels good.  Active.  No fevers, sweats or weight loss. PERFORMANCE STATUS (ECOG):0 HEENT:  No visual changes, runny nose, sore throat, mouth sores or tenderness. Lungs: No shortness of breath or cough.  No hemoptysis. Cardiac:  No chest pain, palpitations, orthopnea, or PND. GI:  No nausea, vomiting, diarrhea, constipation, melena or hematochezia. GU:  No urgency, frequency, dysuria, or  hematuria. Musculoskeletal:  No back pain.  No joint pain.  No muscle tenderness. Extremities:  No pain or swelling. Skin:  No rashes or skin changes. Neuro:  No headache, numbness or weakness, balance or coordination issues. Endocrine:  No diabetes, thyroid issues, hot flashes or night sweats. Psych:  No mood changes, depression or anxiety. Pain:  No focal pain. Review of systems:  All other systems reviewed and found to be negative. As per HPI. Otherwise, a complete review of systems is negatve.  PAST MEDICAL HISTORY: Past Medical History:  Diagnosis Date  . Cancer ([place]) 09/22/2008   rt breast  . GERD (gastroesophageal reflux disease)   . Hyperlipidemia   . Hypertension   . Pterygium eye, right   . Seasonal allergies     PAST SURGICAL HISTORY: Past Surgical History:  Procedure Laterality Date  . ABDOMINAL HYSTERECTOMY     still has cervix  . AUGMENTATION MAMMAPLASTY  02/03/2009   tranflap  . BREAST EXCISIONAL BIOPSY Right 09/22/2008   lumpectomy +  . CHOLECYSTECTOMY    . COLONOSCOPY  2013  . COLONOSCOPY WITH PROPOFOL N/A 08/03/2016   Procedure: COLONOSCOPY WITH PROPOFOL;  Surgeon: [name], MD;  Location: [facility] ENDOSCOPY;  Service: Endoscopy;  Laterality: N/A;  . ESOPHAGOGASTRODUODENOSCOPY (EGD) WITH PROPOFOL N/A 08/03/2016   Procedure: ESOPHAGOGASTRODUODENOSCOPY (EGD) WITH PROPOFOL;  Surgeon: [name], MD;  Location: [facility] ENDOSCOPY;  Service: Endoscopy;  Laterality: N/A;  . GIVENS CAPSULE STUDY N/A 08/14/2016   Procedure: GIVENS CAPSULE STUDY;  Surgeon: [name], MD;  Location: [facility] ENDOSCOPY;  Service: Gastroenterology;  Laterality: N/A;  . INGUINAL HERNIA REPAIR Right   .  LAPAROSCOPIC CHOLECYSTECTOMY W/ CHOLANGIOGRAPHY  1999  . LAPAROSCOPIC GASTRIC BYPASS  08/17/2013  . MASTECTOMY Bilateral 02/03/2009   bilat mast with tramflap recon.  . sleep apnea surgery  2003    FAMILY HISTORY Family History  Problem Relation Age of Onset  . Breast cancer Mother 1  .  Breast cancer Maternal Aunt 48    twin sister to mother    ADVANCED DIRECTIVES:  Patient does not have any living will or healthcare power of attorney.  Information was given .  Available resources had been discussed.  We will follow-up on subsequent appointments regarding this issue  HEALTH MAINTENANCE: Social History  Substance Use Topics  . Smoking status: Never Smoker  . Smokeless tobacco: Never Used  . Alcohol use 0.0 oz/week     Comment: occasional consumption      No Known Allergies  Current Outpatient Prescriptions  Medication Sig Dispense Refill  . BIOTIN PO Take by mouth.    . Calcium Carbonate-Vitamin D (CALCIUM-VITAMIN D3 PO) Take by mouth.    . Cyanocobalamin (VITAMIN B-12) 2500 MCG SUBL Place under the tongue.    Marland Kitchen desloratadine (CLARINEX) 5 MG tablet Take by mouth.    . Multiple Vitamin (MULTI-VITAMINS) TABS Take by mouth.    . Na Sulfate-K Sulfate-Mg Sulf (SUPREP BOWEL PREP KIT) 17.5-3.13-1.6 GM/180ML SOLN Take 1 kit by mouth as directed. 1 Bottle 0  . sertraline (ZOLOFT) 50 MG tablet Take 50 mg by mouth daily.     Marland Kitchen VITAMIN E PO Take by mouth.     No current facility-administered medications for this visit.     OBJECTIVE:  There were no vitals filed for this visit.   There is no height or weight on file to calculate BMI.    ECOG FS:0 - Asymptomatic  PHYSICAL EXAM: General  status: Performance status is good.  Patient has not lost significant weight. Since last evaluation there is no significant change in the general status HEENT: No evidence of stomatitis. Sclera and conjunctivae :: No jaundice.   pale looking. Lungs: Air  entry equal on both sides.  No rhonchi.  No rales.  Cardiac: Heart sounds are normal.  No pericardial rub.  No murmur. Lymphatic system: Cervical, axillary, inguinal, lymph nodes not palpable GI: Abdomen is soft.liver and spleen not palpable.  No ascites.  Bowel sounds are normal.  No other palpable masses.  No tenderness . Lower  extremity: No edema Neurological system: Higher functions, cranial nerves intact no evidence of peripheral neuropathy. Skin: No rash.  No ecchymosis.. No petechial hemorrhages Examination of both breasts: Mastectomy with reconstructive surgery no palpable masses.   LAB RESULTS:  CBC Latest Ref Rng & Units 07/20/2016 07/21/2015  WBC 3.6 - 11.0 K/uL 6.0 5.8  Hemoglobin 12.0 - 16.0 g/dL 10.9(L) 12.8  Hematocrit 35.0 - 47.0 % 32.8(L) 38.1  Platelets 150 - 440 K/uL 367 323    No visits with results within 5 Day(s) from this visit.  Latest known visit with results is:  Admission on 08/03/2016, Discharged on 08/03/2016  Component Date Value Ref Range Status  . SURGICAL PATHOLOGY 08/06/2016    Final                   Value:Surgical Pathology CASE: [id number] PATIENT: Ardeth Lukin Surgical Pathology Report     SPECIMEN SUBMITTED: A. Jejunum; cbx B. Colon polyp, cecum; cbx  CLINICAL HISTORY: None provided  PRE-OPERATIVE DIAGNOSIS: Iron deficiency anemia D50.9  POST-OPERATIVE DIAGNOSIS: IDA; gastric biopsy; colon polyp  DIAGNOSIS: A. JEJUNUM; COLD BIOPSY: - SMALL BOWEL MUCOSA WITH INTACT VILLOUS ARCHITECTURE. - NEGATIVE FOR INTRAEPITHELIAL LYMPHOCYTOSIS, DYSPLASIA AND MALIGNANCY.  B. COLON POLYP, CECUM; COLD BIOPSY: - MELANOSIS COLI. - NEGATIVE FOR DYSPLASIA AND MALIGNANCY.   GROSS DESCRIPTION:  A. Labeled: C BX jejunum  Tissue fragment(s): multiple  Size: aggregate, 1.0 x 0.5 x 0.1 cm  Description: pink-tan fragments  Entirely submitted in 1 cassette(s).   B. Labeled: C BX polyp cecum  Tissue fragment(s): 1  Size: 0.4 cm  Description: brown fragment  Entirely submitted in 1 cassette(s).    Final Diagnosis performed by Delorse Lek, MD.  Electron                         ically signed 08/06/2016 10:06:33AM    The electronic signature indicates that the named Attending Pathologist has evaluated the specimen  Technical component performed at  [facility], [address] [place], [place] [address] Lab: [phone] Dir: Darrick Penna. Evette Doffing, MD  Professional component performed at [facility], [facility], [place], [place], [place] [address] Lab: [phone] Dir: Dellia Nims. Reuel Derby, MD          ASSESSMENT: Plan:   1. Carcinoma of breast. Status post bilateral mastectomy and reconstruction. Stage 1 disease, with negative lymph node. No evidence of recurrent disease. Small area of likely scar tissue felt at 12 oclock above the breast, no change in size per patient. She reports area has been present since reconstruction with no changes.  ER/ PR positive, on Aromasin and tolerating well. She has routine follow up with Dr. Emily Filbert for GYN evaluation.  Last mammogram in Feb 2014 was negative, [facility] 2. She has her yearly mammogram scheduled for the end of April 2015.  year MEDICAL DECISION MAKING:  Try to get another mammogram done. There is no evidence of recurrent disease we will continue to follow patient every year All lab data has been reviewed 2.  Patient has slightly elevated diastolic blood pressure and was informed to get reevaluation done with primary care physician  Patient expressed understanding and was in agreement with this plan. She also understands that She can call clinic at any time with any questions, concerns, or complaints.    No matching staging information was found for the patient.  Creola Corn, MD   08/17/2016 9:55 AM  No problem-specific Assessment & Plan notes found for this encounter.

## 2016-08-21 ENCOUNTER — Inpatient Hospital Stay: Payer: BLUE CROSS/BLUE SHIELD

## 2016-08-21 VITALS — BP 147/85 | HR 61 | Temp 97.0°F | Resp 18

## 2016-08-21 DIAGNOSIS — D508 Other iron deficiency anemias: Secondary | ICD-10-CM

## 2016-08-21 DIAGNOSIS — D509 Iron deficiency anemia, unspecified: Secondary | ICD-10-CM | POA: Diagnosis not present

## 2016-08-21 DIAGNOSIS — Z9884 Bariatric surgery status: Secondary | ICD-10-CM

## 2016-08-21 MED ORDER — SODIUM CHLORIDE 0.9 % IV SOLN: 200.0000 mg | Freq: Once | INTRAVENOUS | Status: DC

## 2016-08-21 MED ORDER — SODIUM CHLORIDE 0.9 % IV SOLN
Freq: Once | INTRAVENOUS | Status: AC
  Administered 2016-08-21: 14:00:00 via INTRAVENOUS
  Filled 2016-08-21: qty 1000

## 2016-08-21 MED ORDER — IRON SUCROSE 20 MG/ML IV SOLN
200.0000 mg | Freq: Once | INTRAVENOUS | Status: AC
  Administered 2016-08-21: 200 mg via INTRAVENOUS
  Filled 2016-08-21: qty 10

## 2016-08-26 NOTE — Progress Notes (Signed)
Chief Complaint: She is here for f/u fro IDA noted at her last visit in 07/2016 She also follows here for breast cancer as noted below> HPI: 1. Abnormal mammogram, followed by lumpectomy and Sentinel lymph node biopsy.  Patient had an abnormal mammogram with microcalcifications in the right upper breast. 2. Biopsy was done on December 23    with needle lLocalization 3. 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 A right partial mastectomy in January 2010. And sentinel lymph node biopsy, negative 5. 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 Patient has finished 2 years of tamoxifen, now would be switched to Aromasin (July, 2012) 7.patient will discontinue Aromasin has has finished   total 5  years aromasin.  (July 15, 2014) 8.BRCA mutation was negative   Patient had a bilateral mastectomy with reconstruction.   INTERVAL HISTORY:    Patient Active Problem List   Diagnosis Date Noted  . H/O gastric bypass 08/16/2016  . Benign neoplasm of cecum   . Iron deficiency anemia secondary to inadequate dietary iron intake 07/27/2016  . Hyperlipidemia   . Hypertension   . GERD (gastroesophageal reflux disease)   . Benign essential HTN 11/03/2013  . Acid reflux 11/03/2013  . Combined fat and carbohydrate induced hyperlipemia 11/03/2013  . H/O malignant neoplasm of breast 11/03/2013    has No Known Allergies.  MEDICAL HISTORY: Past Medical History:  Diagnosis Date  . Cancer ([place]) 09/22/2008   rt breast  . GERD (gastroesophageal reflux disease)   . Hyperlipidemia   . Hypertension   . Pterygium eye, right   . Seasonal allergies      SURGICAL HISTORY: Past Surgical History:  Procedure Laterality Date  . ABDOMINAL HYSTERECTOMY     still has cervix  . AUGMENTATION MAMMAPLASTY  02/03/2009   tranflap  . BREAST EXCISIONAL BIOPSY Right 09/22/2008   lumpectomy +  . CHOLECYSTECTOMY    . COLONOSCOPY  2013  . COLONOSCOPY WITH PROPOFOL N/A 08/03/2016   Procedure: COLONOSCOPY WITH PROPOFOL;  Surgeon: [name], MD;  Location: [facility];  Service: Endoscopy;  Laterality: N/A;  . ESOPHAGOGASTRODUODENOSCOPY (EGD) WITH PROPOFOL N/A 08/03/2016   Procedure: ESOPHAGOGASTRODUODENOSCOPY (EGD) WITH PROPOFOL;  Surgeon: [name], MD;  Location: [facility];  Service: Endoscopy;  Laterality: N/A;  . GIVENS CAPSULE STUDY N/A 08/14/2016   Procedure: GIVENS CAPSULE STUDY;  Surgeon: [name], MD;  Location: [facility] ENDOSCOPY;  Service: Gastroenterology;  Laterality: N/A;  . INGUINAL HERNIA REPAIR Right   . LAPAROSCOPIC CHOLECYSTECTOMY W/ CHOLANGIOGRAPHY  1999  . LAPAROSCOPIC GASTRIC BYPASS  08/17/2013  . MASTECTOMY Bilateral 02/03/2009   bilat mast with tramflap recon.  . sleep apnea surgery  2003  S/p gastric bypass Hysterectomy 2007  SOCIAL HISTORY: Social History   Social History  . Marital status: Married    Spouse name: N/A  . Number of children: N/A  . Years of education: N/A   Occupational History  . Not on file.   Social History Main Topics  . Smoking status: Never Smoker  . Smokeless tobacco: Never Used  . Alcohol use 0.0 oz/week     Comment: occasional consumption  . Drug use: No  . Sexual activity: Not on  file   Other Topics Concern  . Not on file   Social History Narrative  . No narrative on file    FAMILY HISTORY: Family History  Problem Relation Age of Onset  . Breast cancer Mother 51  . Breast cancer Maternal Aunt 34    twin sister to mother     PHYSICAL EXAMINATION  ECOG PERFORMANCE STATUS: 0 - Asymptomatic  Vitals:   08/17/16 1109  BP: (!) 152/89  Pulse: 61  Temp: 99.1 F (37.3  C)    LABORATORY DATA:  CBC    Component Value Date/Time   WBC 6.2 08/17/2016 1040   RBC 3.94 08/17/2016 1040   HGB 10.5 (L) 08/17/2016 1040   HGB 13.1 07/15/2014 1113   HCT 31.9 (L) 08/17/2016 1040   HCT 39.7 07/15/2014 1113   PLT 359 08/17/2016 1040   PLT 319 07/15/2014 1113   MCV 80.9 08/17/2016 1040   MCV 95 07/15/2014 1113   MCH 26.7 08/17/2016 1040   MCHC 33.0 08/17/2016 1040   RDW 14.3 08/17/2016 1040   RDW 13.5 07/15/2014 1113   LYMPHSABS 1.9 08/17/2016 1040   LYMPHSABS 2.0 07/15/2014 1113   MONOABS 0.6 08/17/2016 1040   MONOABS 0.5 07/15/2014 1113   EOSABS 0.2 08/17/2016 1040   EOSABS 0.1 07/15/2014 1113   BASOSABS 0.1 08/17/2016 1040   BASOSABS 0.1 07/15/2014 1113    CMP     Component Value Date/Time   NA 137 07/20/2016 1008   NA 140 07/15/2014 1113   K 4.3 07/20/2016 1008   K 4.4 07/15/2014 1113   CL 102 07/20/2016 1008   CL 104 07/15/2014 1113   CO2 27 07/20/2016 1008   CO2 30 07/15/2014 1113   GLUCOSE 93 07/20/2016 1008   GLUCOSE 80 07/15/2014 1113   BUN 20 07/20/2016 1008   BUN 18 07/15/2014 1113   CREATININE 0.74 07/20/2016 1008   CREATININE 0.81 07/15/2014 1113   CALCIUM 9.2 07/20/2016 1008   CALCIUM 9.1 07/15/2014 1113   PROT 7.4 07/20/2016 1008   PROT 7.0 07/15/2014 1113   ALBUMIN 4.1 07/20/2016 1008   ALBUMIN 3.8 07/15/2014 1113   AST 24 07/20/2016 1008   AST 21 07/15/2014 1113   ALT 23 07/20/2016 1008   ALT 39 07/15/2014 1113   ALKPHOS 61 07/20/2016 1008   ALKPHOS 75 07/15/2014 1113   BILITOT 0.5 07/20/2016 1008   BILITOT 0.3 07/15/2014 1113   GFRNONAA >60 07/20/2016 1008   GFRNONAA >60 07/15/2014 1113   GFRNONAA >60 01/13/2014 1418   GFRAA >60 07/20/2016 1008   GFRAA >60 07/15/2014 1113   GFRAA >60 01/13/2014 1418   Assessment and Plan:  Breast cancer  Stage I s/p bilateral mastectomy 2010, now reconstructed Completed Five years of Aromasin as of October 2015 Doing well, no signs of recurrence- Please see my last notes  dated 07/20/2016 for details and surveillance plan   Iron deficiency anemia likely due to gastric bypass, She is being worked up by [place], so far had EGD and colonoscopy on 11/ 3/ 2017 , negative for bleeding/malignancy, she has a f/u for work up foe celiac disease.  Ferritin was very low at 6 and hgb is10.5, she is unlikely  to absorb orally due to gastric bypass, therefore, we will begin Iv iron, she prefers this too, and understands the side effcts, risks and alternatives   Creola Corn, MD 08/17/2016

## 2016-09-04 ENCOUNTER — Inpatient Hospital Stay: Payer: BLUE CROSS/BLUE SHIELD | Attending: Hematology and Oncology

## 2016-09-04 VITALS — BP 149/89 | HR 63 | Temp 97.1°F | Resp 18

## 2016-09-04 DIAGNOSIS — Z9884 Bariatric surgery status: Secondary | ICD-10-CM

## 2016-09-04 DIAGNOSIS — D508 Other iron deficiency anemias: Secondary | ICD-10-CM | POA: Insufficient documentation

## 2016-09-04 DIAGNOSIS — Z79899 Other long term (current) drug therapy: Secondary | ICD-10-CM | POA: Insufficient documentation

## 2016-09-04 MED ORDER — IRON SUCROSE 20 MG/ML IV SOLN
200.0000 mg | Freq: Once | INTRAVENOUS | Status: AC
  Administered 2016-09-04: 200 mg via INTRAVENOUS
  Filled 2016-09-04: qty 10

## 2016-09-04 MED ORDER — SODIUM CHLORIDE 0.9 % IV SOLN: 200.0000 mg | Freq: Once | INTRAVENOUS | Status: DC

## 2016-09-04 MED ORDER — SODIUM CHLORIDE 0.9 % IV SOLN
Freq: Once | INTRAVENOUS | Status: AC
  Administered 2016-09-04: 14:00:00 via INTRAVENOUS
  Filled 2016-09-04: qty 1000

## 2016-09-11 ENCOUNTER — Inpatient Hospital Stay: Payer: BLUE CROSS/BLUE SHIELD

## 2016-09-11 VITALS — BP 158/87 | HR 66 | Temp 96.4°F | Resp 18

## 2016-09-11 DIAGNOSIS — Z9884 Bariatric surgery status: Secondary | ICD-10-CM

## 2016-09-11 DIAGNOSIS — D508 Other iron deficiency anemias: Secondary | ICD-10-CM

## 2016-09-11 MED ORDER — IRON SUCROSE 20 MG/ML IV SOLN
200.0000 mg | Freq: Once | INTRAVENOUS | Status: AC
  Administered 2016-09-11: 200 mg via INTRAVENOUS
  Filled 2016-09-11: qty 10

## 2016-09-11 MED ORDER — SODIUM CHLORIDE 0.9 % IV SOLN
Freq: Once | INTRAVENOUS | Status: AC
  Administered 2016-09-11: 14:00:00 via INTRAVENOUS
  Filled 2016-09-11: qty 1000

## 2016-09-18 ENCOUNTER — Inpatient Hospital Stay: Payer: BLUE CROSS/BLUE SHIELD

## 2016-09-18 VITALS — BP 157/95 | HR 64 | Temp 97.1°F | Resp 18

## 2016-09-18 DIAGNOSIS — D508 Other iron deficiency anemias: Secondary | ICD-10-CM | POA: Diagnosis not present

## 2016-09-18 DIAGNOSIS — Z9884 Bariatric surgery status: Secondary | ICD-10-CM

## 2016-09-18 MED ORDER — SODIUM CHLORIDE 0.9 % IV SOLN
Freq: Once | INTRAVENOUS | Status: AC
  Administered 2016-09-18: 14:00:00 via INTRAVENOUS
  Filled 2016-09-18: qty 1000

## 2016-09-18 MED ORDER — SODIUM CHLORIDE 0.9 % IV SOLN: 200.0000 mg | Freq: Once | INTRAVENOUS | Status: DC

## 2016-09-18 MED ORDER — IRON SUCROSE 20 MG/ML IV SOLN
200.0000 mg | Freq: Once | INTRAVENOUS | Status: AC
  Administered 2016-09-18: 200 mg via INTRAVENOUS
  Filled 2016-09-18: qty 10

## 2016-11-14 ENCOUNTER — Other Ambulatory Visit: Payer: Self-pay | Admitting: *Deleted

## 2016-11-14 DIAGNOSIS — Z853 Personal history of malignant neoplasm of breast: Secondary | ICD-10-CM

## 2016-11-16 ENCOUNTER — Inpatient Hospital Stay (HOSPITAL_BASED_OUTPATIENT_CLINIC_OR_DEPARTMENT_OTHER): Payer: BLUE CROSS/BLUE SHIELD | Admitting: Hematology and Oncology

## 2016-11-16 ENCOUNTER — Inpatient Hospital Stay: Payer: BLUE CROSS/BLUE SHIELD | Attending: Hematology and Oncology

## 2016-11-16 VITALS — BP 168/98 | HR 66 | Temp 94.9°F | Resp 18 | Wt 160.1 lb

## 2016-11-16 DIAGNOSIS — Z9884 Bariatric surgery status: Secondary | ICD-10-CM | POA: Insufficient documentation

## 2016-11-16 DIAGNOSIS — D508 Other iron deficiency anemias: Secondary | ICD-10-CM

## 2016-11-16 DIAGNOSIS — Z86010 Personal history of colonic polyps: Secondary | ICD-10-CM | POA: Diagnosis not present

## 2016-11-16 DIAGNOSIS — K219 Gastro-esophageal reflux disease without esophagitis: Secondary | ICD-10-CM | POA: Diagnosis not present

## 2016-11-16 DIAGNOSIS — D509 Iron deficiency anemia, unspecified: Secondary | ICD-10-CM | POA: Insufficient documentation

## 2016-11-16 DIAGNOSIS — R232 Flushing: Secondary | ICD-10-CM

## 2016-11-16 DIAGNOSIS — E785 Hyperlipidemia, unspecified: Secondary | ICD-10-CM | POA: Diagnosis not present

## 2016-11-16 DIAGNOSIS — Z9013 Acquired absence of bilateral breasts and nipples: Secondary | ICD-10-CM | POA: Insufficient documentation

## 2016-11-16 DIAGNOSIS — I10 Essential (primary) hypertension: Secondary | ICD-10-CM

## 2016-11-16 DIAGNOSIS — Z853 Personal history of malignant neoplasm of breast: Secondary | ICD-10-CM

## 2016-11-16 DIAGNOSIS — D649 Anemia, unspecified: Secondary | ICD-10-CM

## 2016-11-16 DIAGNOSIS — C50911 Malignant neoplasm of unspecified site of right female breast: Secondary | ICD-10-CM | POA: Insufficient documentation

## 2016-11-16 DIAGNOSIS — Z803 Family history of malignant neoplasm of breast: Secondary | ICD-10-CM

## 2016-11-16 DIAGNOSIS — Z79899 Other long term (current) drug therapy: Secondary | ICD-10-CM | POA: Insufficient documentation

## 2016-11-16 LAB — CBC WITH DIFFERENTIAL/PLATELET
Basophils Absolute: 0 10*3/uL (ref 0–0.1)
Basophils Relative: 1 %
Eosinophils Absolute: 0.1 10*3/uL (ref 0–0.7)
Eosinophils Relative: 2 %
HCT: 37 % (ref 35.0–47.0)
Hemoglobin: 12.7 g/dL (ref 12.0–16.0)
Lymphocytes Relative: 35 %
Lymphs Abs: 1.9 10*3/uL (ref 1.0–3.6)
MCH: 29.6 pg (ref 26.0–34.0)
MCHC: 34.4 g/dL (ref 32.0–36.0)
MCV: 86 fL (ref 80.0–100.0)
Monocytes Absolute: 0.4 10*3/uL (ref 0.2–0.9)
Monocytes Relative: 7 %
Neutro Abs: 3 10*3/uL (ref 1.4–6.5)
Neutrophils Relative %: 55 %
Platelets: 304 10*3/uL (ref 150–440)
RBC: 4.31 MIL/uL (ref 3.80–5.20)
RDW: 16.3 % — ABNORMAL HIGH (ref 11.5–14.5)
WBC: 5.4 10*3/uL (ref 3.6–11.0)

## 2016-11-16 LAB — COMPREHENSIVE METABOLIC PANEL
ALT: 21 U/L (ref 14–54)
AST: 24 U/L (ref 15–41)
Albumin: 4.1 g/dL (ref 3.5–5.0)
Alkaline Phosphatase: 55 U/L (ref 38–126)
Anion gap: 9 (ref 5–15)
BUN: 17 mg/dL (ref 6–20)
CO2: 26 mmol/L (ref 22–32)
Calcium: 9.4 mg/dL (ref 8.9–10.3)
Chloride: 102 mmol/L (ref 101–111)
Creatinine, Ser: 0.72 mg/dL (ref 0.44–1.00)
GFR calc Af Amer: >60 mL/min (ref >60)
GFR calc non Af Amer: >60 mL/min (ref >60)
Glucose, Bld: 105 mg/dL — ABNORMAL HIGH (ref 65–99)
Potassium: 3.6 mmol/L (ref 3.5–5.1)
Sodium: 137 mmol/L (ref 135–145)
Total Bilirubin: 0.5 mg/dL (ref 0.3–1.2)
Total Protein: 7.3 g/dL (ref 6.5–8.1)

## 2016-11-16 LAB — IRON AND TIBC
Iron: 112 ug/dL (ref 28–170)
Saturation Ratios: 29 % (ref 10.4–31.8)
TIBC: 391 ug/dL (ref 250–450)
UIBC: 279 ug/dL

## 2016-11-16 LAB — FERRITIN: Ferritin: 68 ng/mL (ref 11–307)

## 2016-11-16 NOTE — Progress Notes (Signed)
[facility] day:  11/16/2016  Chief Complaint: Jessica Hammond is a 56 y.o. female with stage I right breast cancer who is seen for reassessment.  HPI: The patient was diagnosed with breast cancer in 2009. She presented with an abnormal mammogram.  She underwent lumpectomy and sentinel lymph node biopsy in 10/2008 by Dr. Rochel Brome. 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 Pathologic stage was T1aN0. Tumor was ER positive, PR positive and HER-2 negative.  Because of positive margins she underwent bilateral mastectomy and TRAM flap reconstruction on 02/03/2009 by Dr. Zerita Boers.  She tolerated the procedure well.   Following her mastectomy, she began hormonal therapy. She received 2 years of tamoxifen and then was switched to Aromasin in 04/2011. She discontinued Aromasin on 07/15/2014.  CA 27.29 has been followed: 17.4 in 09/22/2010, 18.7 on 12/24/2011, 14.3 on 06/25/2012, 15.5 on 01/14/2013, 17.4 on 07/15/2013, 5.2 on 07/15/2014, and 17.2 on 07/20/2016.  She states that she continues to receive mammograms yearly per Dr. Edwin Dada suggestion. She underwent mammogram at the mobile unit in 01/2016 in [place]. Mammogram was not performed at [facility] secondary to her mastectomy.  She was last seen in the medical oncology clinic on 08/17/2016 by Dr. [place] Corn.  She was doing well without evidence of recurrence.  CBC revealed a hematocrit 31.9, hemoglobin 10.5, MCV 80.9, platelets 359,000, white count 6200 with an ANC of 3400.    She has a history of gastric bypass surgery on 08/17/2013. She was documented with iron deficiency. Labs on 07/20/2016 revealed a ferritin 5 with a saturation of 7% and a TIBC of 505 (high).  B12 was 4249.  SPEP revealed no monoclonal protein.  She received Venofer 4 (11/21, 12/5, 12/12, and 09/18/2016).  She is on oral B12  supplementation.  EGD by Dr, Kathryne Sharper on 08/03/2016 was normal.  Colonscopy on 08/03/2016 was normal except for 3 mm sessile polyp in the cecum. Pathology revealed melanosis coli negative for dysplasia or malignancy.  Symptomatically, she feels good. She does note some hot flashes. She denies any bone pain. She denies any GI bleeding.   Past Medical History:  Diagnosis Date  . Cancer ([place]) 09/22/2008   rt breast  . GERD (gastroesophageal reflux disease)   . Hyperlipidemia   . Hypertension   . Pterygium eye, right   . Seasonal allergies     Past Surgical History:  Procedure Laterality Date  . ABDOMINAL HYSTERECTOMY     still has cervix  . AUGMENTATION MAMMAPLASTY  02/03/2009   tranflap  . BREAST EXCISIONAL BIOPSY Right 09/22/2008   lumpectomy +  . CHOLECYSTECTOMY    . COLONOSCOPY  2013  . COLONOSCOPY WITH PROPOFOL N/A 08/03/2016   Procedure: COLONOSCOPY WITH PROPOFOL;  Surgeon: [name], MD;  Location: [facility] ENDOSCOPY;  Service: Endoscopy;  Laterality: N/A;  . ESOPHAGOGASTRODUODENOSCOPY (EGD) WITH PROPOFOL N/A 08/03/2016   Procedure: ESOPHAGOGASTRODUODENOSCOPY (EGD) WITH PROPOFOL;  Surgeon: [name], MD;  Location: [facility] ENDOSCOPY;  Service: Endoscopy;  Laterality: N/A;  . GIVENS CAPSULE STUDY N/A 08/14/2016   Procedure: GIVENS CAPSULE STUDY;  Surgeon: [name], MD;  Location: [facility] ENDOSCOPY;  Service: Gastroenterology;  Laterality: N/A;  . INGUINAL HERNIA REPAIR Right   . LAPAROSCOPIC CHOLECYSTECTOMY W/ CHOLANGIOGRAPHY  1999  . LAPAROSCOPIC GASTRIC BYPASS  08/17/2013  . MASTECTOMY Bilateral 02/03/2009   bilat mast with tramflap recon.  . sleep apnea surgery  2003    Family History  Problem Relation Age of Onset  . Breast cancer Mother 71  . Breast cancer Maternal Aunt 39    twin sister to mother    Social History:  reports that she has never smoked. She has never used smokeless tobacco. She reports that she drinks alcohol. She reports that she does not use drugs.   She works at [employer].  She lives in [place].  The patient is alone today.  Allergies: No Known Allergies  Current Medications: Current Outpatient Prescriptions  Medication Sig Dispense Refill  . BIOTIN PO Take by mouth.    . Calcium Carbonate-Vitamin D (CALCIUM-VITAMIN D3 PO) Take by mouth.    . Cyanocobalamin (VITAMIN B-12) 2500 MCG SUBL Place under the tongue.    Marland Kitchen desloratadine (CLARINEX) 5 MG tablet Take by mouth.    . Multiple Vitamin (MULTI-VITAMINS) TABS Take by mouth.    . sertraline (ZOLOFT) 100 MG tablet Take by mouth.    Marland Kitchen VITAMIN E PO Take by mouth.     No current facility-administered medications for this visit.     Review of Systems:  GENERAL:  Feels good.  Active.  No fevers, sweats or weight loss. PERFORMANCE STATUS (ECOG):  0 HEENT:  No visual changes, runny nose, sore throat, mouth sores or tenderness. Lungs: No shortness of breath or cough.  No hemoptysis. Cardiac:  No chest pain, palpitations, orthopnea, or PND. GI:  No nausea, vomiting, diarrhea, constipation, melena or hematochezia. GU:  No urgency, frequency, dysuria, or hematuria. Musculoskeletal:  No back pain.  No joint pain.  No muscle tenderness. Extremities:  No pain or swelling. Skin:  No rashes or skin changes. Neuro:  No headache, numbness or weakness, balance or coordination issues. Endocrine:  No diabetes, thyroid issues.  Hot flashes.  No night sweats. Psych:  No mood changes, depression or anxiety. Pain:  No focal pain. Review of systems:  All other systems reviewed and found to be negative.  Physical Exam: Blood pressure (!) 168/98, pulse 66, temperature (!) 94.9 F (34.9 C), temperature source Tympanic, resp. rate 18, weight 160 lb 0.9 oz (72.6 kg). GENERAL:  Well developed, well nourished, woman sitting comfortably in the exam room in no acute distress. MENTAL STATUS:  Alert and oriented to person, place and time. HEAD:  Long auburn hair.  Normocephalic, atraumatic, face symmetric,  no Cushingoid features. EYES:  Glasses.  Blue eyes.  Pupils equal round and reactive to light and accomodation.  No conjunctivitis or scleral icterus. ENT:  Oropharynx clear without lesion.  Tongue normal. Mucous membranes moist.  RESPIRATORY:  Clear to auscultation without rales, wheezes or rhonchi. CARDIOVASCULAR:  Regular rate and rhythm without murmur, rub or gallop. BREAST:  S/p bilateral mastectomy with reconstruction.  No skin changes or nodularity.  Firm area on right between 6- 8 o'clock (old per patient).  1 cm firm area high at the 12 o'clock position on th left (old per patient). ABDOMEN:  Soft, non-tender, with active bowel sounds, and no hepatosplenomegaly.  No masses. SKIN:  No rashes, ulcers or lesions. EXTREMITIES: No edema, no skin discoloration or tenderness.  No palpable cords. LYMPH NODES: No palpable cervical, supraclavicular, axillary or inguinal adenopathy  NEUROLOGICAL: Unremarkable. PSYCH:  Appropriate.   Appointment on 11/16/2016  Component Date Value Ref Range Status  . WBC 11/16/2016 5.4  3.6 - 11.0 K/uL Final  . RBC 11/16/2016 4.31  3.80 - 5.20 MIL/uL Final  . Hemoglobin 11/16/2016 12.7  12.0 - 16.0  g/dL Final  . HCT 11/16/2016 37.0  35.0 - 47.0 % Final  . MCV 11/16/2016 86.0  80.0 - 100.0 fL Final  . MCH 11/16/2016 29.6  26.0 - 34.0 pg Final  . MCHC 11/16/2016 34.4  32.0 - 36.0 g/dL Final  . RDW 11/16/2016 16.3* 11.5 - 14.5 % Final  . Platelets 11/16/2016 304  150 - 440 K/uL Final  . Neutrophils Relative % 11/16/2016 55  % Final  . Neutro Abs 11/16/2016 3.0  1.4 - 6.5 K/uL Final  . Lymphocytes Relative 11/16/2016 35  % Final  . Lymphs Abs 11/16/2016 1.9  1.0 - 3.6 K/uL Final  . Monocytes Relative 11/16/2016 7  % Final  . Monocytes Absolute 11/16/2016 0.4  0.2 - 0.9 K/uL Final  . Eosinophils Relative 11/16/2016 2  % Final  . Eosinophils Absolute 11/16/2016 0.1  0 - 0.7 K/uL Final  . Basophils Relative 11/16/2016 1  % Final  . Basophils Absolute  11/16/2016 0.0  0 - 0.1 K/uL Final  . Sodium 11/16/2016 137  135 - 145 mmol/L Final  . Potassium 11/16/2016 3.6  3.5 - 5.1 mmol/L Final  . Chloride 11/16/2016 102  101 - 111 mmol/L Final  . CO2 11/16/2016 26  22 - 32 mmol/L Final  . Glucose, Bld 11/16/2016 105* 65 - 99 mg/dL Final  . BUN 11/16/2016 17  6 - 20 mg/dL Final  . Creatinine, Ser 11/16/2016 0.72  0.44 - 1.00 mg/dL Final  . Calcium 11/16/2016 9.4  8.9 - 10.3 mg/dL Final  . Total Protein 11/16/2016 7.3  6.5 - 8.1 g/dL Final  . Albumin 11/16/2016 4.1  3.5 - 5.0 g/dL Final  . AST 11/16/2016 24  15 - 41 U/L Final  . ALT 11/16/2016 21  14 - 54 U/L Final  . Alkaline Phosphatase 11/16/2016 55  38 - 126 U/L Final  . Total Bilirubin 11/16/2016 0.5  0.3 - 1.2 mg/dL Final  . GFR calc non Af Amer 11/16/2016 >60  >60 mL/min Final  . GFR calc Af Amer 11/16/2016 >60  >60 mL/min Final   Comment: (NOTE) The eGFR has been calculated using the CKD EPI equation. This calculation has not been validated in all clinical situations. eGFR's persistently <60 mL/min signify possible Chronic Kidney Disease.   . Anion gap 11/16/2016 9  5 - 15 Final  . Ferritin 11/16/2016 68  11 - 307 ng/mL Final  . Iron 11/16/2016 112  28 - 170 ug/dL Final  . TIBC 11/16/2016 391  250 - 450 ug/dL Final  . Saturation Ratios 11/16/2016 29  10.4 - 31.8 % Final  . UIBC 11/16/2016 279  ug/dL Final    Assessment:  ITZAYANNA KASTER is a 56 y.o. female with stage I right breast cancer s/p lumpectomy and sentinel lymph node biopsy in 10/2008. 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 Pathologic stage was T1aN0. Tumor was ER positive, PR positive and HER-2 negative.  She underwent bilateral mastectomy and TRAM flap reconstruction on 02/03/2009 for positive margins.  She received 2 years of tamoxifen and then was switched to Aromasin in 04/2011. She discontinued Aromasin on 07/15/2014.  CA  27.29 has been followed: 17.4 in 09/22/2010, 18.7 on 12/24/2011, 14.3 on 06/25/2012, 15.5 on 01/14/2013, 17.4 on 07/15/2013, 5.2 on 07/15/2014,and 17.2 on 07/20/2016.  She receives mammograms yearly per Dr. Edwin Dada suggestion. She underwent mammogram at the mobile unit in 01/2016 in [place].  She is s/p gastric bypass surgery on 08/17/2013. She has a history of iron deficiency.  Labs on 07/20/2016 revealed a ferritin 5 with a saturation of 7% and a TIBC of 505 (high).  B12 was 4249.  SPEP revealed no monoclonal protein.  She received Venofer 4 (11/21, 12/5, 12/12, and 09/18/2016).  She is on oral B12 supplementation.  EGD on 08/03/2016 was normal.  Colonscopy on 08/03/2016 was normal except for 3 mm sessile polyp in the cecum. Pathology revealed melanosis coli negative for dysplasia or malignancy.  Capsule endoscopy revealed no abnormality.  Symptomatically, she has some hot flashes. She denies any bone pain. She denies any GI bleeding.  Exam reveals post operative changes.  Plan: 1.  Review diagnosis, staging, and management of breast cancer.  Discuss plan for yearly follow-up.  Discuss mammogram controversy.  Discuss plan to follow-up with Dr. Tula Nakayama regarding need for bilateral mammogram (? residual breast tissue). 2.  Discuss gastric bypass and iron deficiency anemia.  Discuss monitoring ferritin and providing IV iron as needed.  Goal ferritin 100. 3.  Labs today: CBC with diff, CMP, CA27.29, ferritin, iron studies. 4.  Bilateral screening mammogram 02/13/2017. 5.  RTC in 6 months for MD assessment and labs (CBC with diff, CMP, ferritin, B12- day before), review mammogram, and +/- Venofer.   Jessica Asal, MD  11/16/2016, 11:01 AM

## 2016-11-16 NOTE — Progress Notes (Signed)
Last seen by Dr. [place].  At that appointment patient's iron was low.  Today's appointment is a follow up regarding the iron levels.BP elevated today. 168/98 HR 66.  Patient states her BP has been running high.  For past 3 visits here BP has been elevated.  Advised patient to call PCP (Dr. Emily Filbert) and let him know the values.  States she will call as she does not have another appointment until November.

## 2016-11-17 ENCOUNTER — Encounter: Payer: Self-pay | Admitting: Hematology and Oncology

## 2016-11-17 LAB — CANCER ANTIGEN 27.29: CA 27.29: 18.7 U/mL (ref 0.0–38.6)

## 2016-11-20 ENCOUNTER — Telehealth: Payer: Self-pay | Admitting: *Deleted

## 2016-11-20 NOTE — Telephone Encounter (Signed)
Pt called with the name of her plastic surgeon: Dr. Nicholaus Bloom at [facility] at [phone].  I will pass it along to Tracie the nurse for Dr. Mike Gip

## 2017-05-16 ENCOUNTER — Inpatient Hospital Stay: Payer: BLUE CROSS/BLUE SHIELD | Attending: Hematology and Oncology

## 2017-05-16 DIAGNOSIS — Z9884 Bariatric surgery status: Secondary | ICD-10-CM | POA: Diagnosis not present

## 2017-05-16 DIAGNOSIS — E785 Hyperlipidemia, unspecified: Secondary | ICD-10-CM | POA: Insufficient documentation

## 2017-05-16 DIAGNOSIS — Z79899 Other long term (current) drug therapy: Secondary | ICD-10-CM | POA: Insufficient documentation

## 2017-05-16 DIAGNOSIS — Z9013 Acquired absence of bilateral breasts and nipples: Secondary | ICD-10-CM | POA: Insufficient documentation

## 2017-05-16 DIAGNOSIS — Z853 Personal history of malignant neoplasm of breast: Secondary | ICD-10-CM | POA: Insufficient documentation

## 2017-05-16 DIAGNOSIS — Z803 Family history of malignant neoplasm of breast: Secondary | ICD-10-CM | POA: Diagnosis not present

## 2017-05-16 DIAGNOSIS — D509 Iron deficiency anemia, unspecified: Secondary | ICD-10-CM | POA: Insufficient documentation

## 2017-05-16 DIAGNOSIS — C50911 Malignant neoplasm of unspecified site of right female breast: Secondary | ICD-10-CM

## 2017-05-16 DIAGNOSIS — K219 Gastro-esophageal reflux disease without esophagitis: Secondary | ICD-10-CM | POA: Diagnosis not present

## 2017-05-16 DIAGNOSIS — I10 Essential (primary) hypertension: Secondary | ICD-10-CM | POA: Diagnosis not present

## 2017-05-16 DIAGNOSIS — D508 Other iron deficiency anemias: Secondary | ICD-10-CM

## 2017-05-16 LAB — COMPREHENSIVE METABOLIC PANEL
ALT: 24 U/L (ref 14–54)
AST: 24 U/L (ref 15–41)
Albumin: 4.2 g/dL (ref 3.5–5.0)
Alkaline Phosphatase: 52 U/L (ref 38–126)
Anion gap: 7 (ref 5–15)
BUN: 18 mg/dL (ref 6–20)
CO2: 29 mmol/L (ref 22–32)
Calcium: 9.7 mg/dL (ref 8.9–10.3)
Chloride: 102 mmol/L (ref 101–111)
Creatinine, Ser: 0.88 mg/dL (ref 0.44–1.00)
GFR calc Af Amer: >60 mL/min (ref >60)
GFR calc non Af Amer: >60 mL/min (ref >60)
Glucose, Bld: 94 mg/dL (ref 65–99)
Potassium: 4.2 mmol/L (ref 3.5–5.1)
Sodium: 138 mmol/L (ref 135–145)
Total Bilirubin: 0.6 mg/dL (ref 0.3–1.2)
Total Protein: 7.3 g/dL (ref 6.5–8.1)

## 2017-05-16 LAB — CBC WITH DIFFERENTIAL/PLATELET
Basophils Absolute: 0 10*3/uL (ref 0–0.1)
Basophils Relative: 1 %
Eosinophils Absolute: 0.2 10*3/uL (ref 0–0.7)
Eosinophils Relative: 3 %
HCT: 35.8 % (ref 35.0–47.0)
Hemoglobin: 12.6 g/dL (ref 12.0–16.0)
Lymphocytes Relative: 34 %
Lymphs Abs: 2 10*3/uL (ref 1.0–3.6)
MCH: 31.6 pg (ref 26.0–34.0)
MCHC: 35.2 g/dL (ref 32.0–36.0)
MCV: 89.9 fL (ref 80.0–100.0)
Monocytes Absolute: 0.5 10*3/uL (ref 0.2–0.9)
Monocytes Relative: 8 %
Neutro Abs: 3.2 10*3/uL (ref 1.4–6.5)
Neutrophils Relative %: 54 %
Platelets: 341 10*3/uL (ref 150–440)
RBC: 3.98 MIL/uL (ref 3.80–5.20)
RDW: 12.5 % (ref 11.5–14.5)
WBC: 5.8 10*3/uL (ref 3.6–11.0)

## 2017-05-16 LAB — FERRITIN: Ferritin: 44 ng/mL (ref 11–307)

## 2017-05-16 LAB — VITAMIN B12: Vitamin B-12: 2917 pg/mL — ABNORMAL HIGH (ref 180–914)

## 2017-05-17 ENCOUNTER — Ambulatory Visit: Payer: BLUE CROSS/BLUE SHIELD | Admitting: Hematology and Oncology

## 2017-05-17 ENCOUNTER — Other Ambulatory Visit: Payer: Self-pay | Admitting: Hematology and Oncology

## 2017-05-17 ENCOUNTER — Encounter: Payer: Self-pay | Admitting: Hematology and Oncology

## 2017-05-17 ENCOUNTER — Inpatient Hospital Stay: Payer: BLUE CROSS/BLUE SHIELD

## 2017-05-17 ENCOUNTER — Ambulatory Visit: Payer: BLUE CROSS/BLUE SHIELD

## 2017-05-17 ENCOUNTER — Inpatient Hospital Stay (HOSPITAL_BASED_OUTPATIENT_CLINIC_OR_DEPARTMENT_OTHER): Payer: BLUE CROSS/BLUE SHIELD | Admitting: Hematology and Oncology

## 2017-05-17 VITALS — BP 103/70 | HR 76 | Temp 97.8°F | Resp 16 | Ht 59.0 in | Wt 155.2 lb

## 2017-05-17 DIAGNOSIS — E785 Hyperlipidemia, unspecified: Secondary | ICD-10-CM

## 2017-05-17 DIAGNOSIS — Z79899 Other long term (current) drug therapy: Secondary | ICD-10-CM | POA: Diagnosis not present

## 2017-05-17 DIAGNOSIS — Z9013 Acquired absence of bilateral breasts and nipples: Secondary | ICD-10-CM

## 2017-05-17 DIAGNOSIS — D508 Other iron deficiency anemias: Secondary | ICD-10-CM

## 2017-05-17 DIAGNOSIS — Z9884 Bariatric surgery status: Secondary | ICD-10-CM

## 2017-05-17 DIAGNOSIS — K219 Gastro-esophageal reflux disease without esophagitis: Secondary | ICD-10-CM | POA: Diagnosis not present

## 2017-05-17 DIAGNOSIS — D509 Iron deficiency anemia, unspecified: Secondary | ICD-10-CM | POA: Diagnosis not present

## 2017-05-17 DIAGNOSIS — C50911 Malignant neoplasm of unspecified site of right female breast: Secondary | ICD-10-CM

## 2017-05-17 DIAGNOSIS — I10 Essential (primary) hypertension: Secondary | ICD-10-CM

## 2017-05-17 DIAGNOSIS — Z853 Personal history of malignant neoplasm of breast: Secondary | ICD-10-CM

## 2017-05-17 DIAGNOSIS — Z803 Family history of malignant neoplasm of breast: Secondary | ICD-10-CM

## 2017-05-17 NOTE — Progress Notes (Signed)
Patient here for follow up IDA

## 2017-05-17 NOTE — Progress Notes (Signed)
[facility] day:  05/17/2017  Chief Complaint: Jessica Hammond is a 56 y.o. female s/p gastric bypass surgery, iron deficiency, and stage I right breast cancer who is seen for 6 month assessment.  HPI: The patient was last seen in the medical oncology clinic on 11/16/2016.  At that time, she was seen for initial assessment by me. She had some hot flashes. CBC, CMP, and CA27.29 were normal.  Ferritin was 68.  Since her last visit, patient reports that she has been doing well. She denies fatigue, sweats, bruising, and weight loss.  She denies pica.   Past Medical History:  Diagnosis Date  . Cancer ([place]) 09/22/2008   rt breast  . GERD (gastroesophageal reflux disease)   . Hyperlipidemia   . Hypertension   . Pterygium eye, right   . Seasonal allergies     Past Surgical History:  Procedure Laterality Date  . ABDOMINAL HYSTERECTOMY     still has cervix  . AUGMENTATION MAMMAPLASTY  02/03/2009   tranflap  . BREAST EXCISIONAL BIOPSY Right 09/22/2008   lumpectomy +  . CHOLECYSTECTOMY    . COLONOSCOPY  2013  . COLONOSCOPY WITH PROPOFOL N/A 08/03/2016   Procedure: COLONOSCOPY WITH PROPOFOL;  Surgeon: [name], MD;  Location: [facility] ENDOSCOPY;  Service: Endoscopy;  Laterality: N/A;  . ESOPHAGOGASTRODUODENOSCOPY (EGD) WITH PROPOFOL N/A 08/03/2016   Procedure: ESOPHAGOGASTRODUODENOSCOPY (EGD) WITH PROPOFOL;  Surgeon: [name], MD;  Location: [facility] ENDOSCOPY;  Service: Endoscopy;  Laterality: N/A;  . GIVENS CAPSULE STUDY N/A 08/14/2016   Procedure: GIVENS CAPSULE STUDY;  Surgeon: [name], MD;  Location: [facility] ENDOSCOPY;  Service: Gastroenterology;  Laterality: N/A;  . INGUINAL HERNIA REPAIR Right   . LAPAROSCOPIC CHOLECYSTECTOMY W/ CHOLANGIOGRAPHY  1999  . LAPAROSCOPIC GASTRIC BYPASS  08/17/2013  . MASTECTOMY Bilateral 02/03/2009   bilat mast with tramflap recon.  . sleep apnea surgery  2003    Family History  Problem Relation Age of Onset  .  Breast cancer Mother 6  . Breast cancer Maternal Aunt 9       twin sister to mother    Social History:  reports that she has never smoked. She has never used smokeless tobacco. She reports that she drinks alcohol. She reports that she does not use drugs.  She works at [employer].  She lives in [place].  The patient is alone today.  Allergies: No Known Allergies  Current Medications: Current Outpatient Prescriptions  Medication Sig Dispense Refill  . BIOTIN PO Take by mouth.    . Calcium Carbonate-Vitamin D (CALCIUM-VITAMIN D3 PO) Take by mouth.    . Cyanocobalamin (VITAMIN B-12) 2500 MCG SUBL Place under the tongue.    Marland Kitchen desloratadine (CLARINEX) 5 MG tablet Take by mouth.    Marland Kitchen lisinopril-hydrochlorothiazide (PRINZIDE,ZESTORETIC) 10-12.5 MG tablet Take by mouth.    . Multiple Vitamin (MULTI-VITAMINS) TABS Take by mouth.    . sertraline (ZOLOFT) 100 MG tablet Take by mouth.    Marland Kitchen VITAMIN E PO Take by mouth.     No current facility-administered medications for this visit.     Review of Systems:  GENERAL:  Feels "good".  No fevers or sweats. Weight down 5 pounds since last clinic visit.  PERFORMANCE STATUS (ECOG):  0 HEENT:  No visual changes, runny nose, sore throat, mouth sores or tenderness. Lungs: No shortness of breath or cough.  No hemoptysis. Cardiac:  No chest pain, palpitations, orthopnea, or PND. GI:  No nausea, vomiting, diarrhea,  constipation, melena or hematochezia.  No pica. GU:  No urgency, frequency, dysuria, or hematuria. Musculoskeletal:  No back pain.  No joint pain.  No muscle tenderness. Extremities:  No pain or swelling. Skin:  No rashes or skin changes. Neuro:  No headache, numbness or weakness, balance or coordination issues. Endocrine:  No diabetes, thyroid issues.  Hot flashes.  No night sweats. Psych:  No mood changes, depression or anxiety. Pain:  No focal pain. Review of systems:  All other systems reviewed and found to be negative.  Physical  Exam: Blood pressure 103/70, pulse 76, temperature 97.8 F (36.6 C), temperature source Tympanic, resp. rate 16, height '4\' 11"'  (1.499 m), weight 155 lb 3.2 oz (70.4 kg). GENERAL:  Well developed, well nourished, woman sitting comfortably in the exam room in no acute distress. MENTAL STATUS:  Alert and oriented to person, place and time. HEAD:  Shoulder length auburn/red hair.  Normocephalic, atraumatic, face symmetric, no Cushingoid features. EYES:  Glasses.  Blue eyes.  Pupils equal round and reactive to light and accomodation.  No conjunctivitis or scleral icterus. ENT:  Oropharynx clear without lesion.  Tongue normal. Mucous membranes moist.  RESPIRATORY:  Clear to auscultation without rales, wheezes or rhonchi. CARDIOVASCULAR:  Regular rate and rhythm without murmur, rub or gallop. ABDOMEN:  Soft, non-tender, with active bowel sounds, and no hepatosplenomegaly.  No masses. SKIN:  No rashes, ulcers or lesions. EXTREMITIES: No edema, no skin discoloration or tenderness.  No palpable cords. LYMPH NODES: No palpable cervical, supraclavicular, axillary or inguinal adenopathy  NEUROLOGICAL: Unremarkable. PSYCH:  Appropriate.   Appointment on 05/16/2017  Component Date Value Ref Range Status  . WBC 05/16/2017 5.8  3.6 - 11.0 K/uL Final  . RBC 05/16/2017 3.98  3.80 - 5.20 MIL/uL Final  . Hemoglobin 05/16/2017 12.6  12.0 - 16.0 g/dL Final  . HCT 05/16/2017 35.8  35.0 - 47.0 % Final  . MCV 05/16/2017 89.9  80.0 - 100.0 fL Final  . MCH 05/16/2017 31.6  26.0 - 34.0 pg Final  . MCHC 05/16/2017 35.2  32.0 - 36.0 g/dL Final  . RDW 05/16/2017 12.5  11.5 - 14.5 % Final  . Platelets 05/16/2017 341  150 - 440 K/uL Final  . Neutrophils Relative % 05/16/2017 54  % Final  . Neutro Abs 05/16/2017 3.2  1.4 - 6.5 K/uL Final  . Lymphocytes Relative 05/16/2017 34  % Final  . Lymphs Abs 05/16/2017 2.0  1.0 - 3.6 K/uL Final  . Monocytes Relative 05/16/2017 8  % Final  . Monocytes Absolute 05/16/2017 0.5   0.2 - 0.9 K/uL Final  . Eosinophils Relative 05/16/2017 3  % Final  . Eosinophils Absolute 05/16/2017 0.2  0 - 0.7 K/uL Final  . Basophils Relative 05/16/2017 1  % Final  . Basophils Absolute 05/16/2017 0.0  0 - 0.1 K/uL Final  . Sodium 05/16/2017 138  135 - 145 mmol/L Final  . Potassium 05/16/2017 4.2  3.5 - 5.1 mmol/L Final  . Chloride 05/16/2017 102  101 - 111 mmol/L Final  . CO2 05/16/2017 29  22 - 32 mmol/L Final  . Glucose, Bld 05/16/2017 94  65 - 99 mg/dL Final  . BUN 05/16/2017 18  6 - 20 mg/dL Final  . Creatinine, Ser 05/16/2017 0.88  0.44 - 1.00 mg/dL Final  . Calcium 05/16/2017 9.7  8.9 - 10.3 mg/dL Final  . Total Protein 05/16/2017 7.3  6.5 - 8.1 g/dL Final  . Albumin 05/16/2017 4.2  3.5 - 5.0 g/dL  Final  . AST 05/16/2017 24  15 - 41 U/L Final  . ALT 05/16/2017 24  14 - 54 U/L Final  . Alkaline Phosphatase 05/16/2017 52  38 - 126 U/L Final  . Total Bilirubin 05/16/2017 0.6  0.3 - 1.2 mg/dL Final  . GFR calc non Af Amer 05/16/2017 >60  >60 mL/min Final  . GFR calc Af Amer 05/16/2017 >60  >60 mL/min Final   Comment: (NOTE) The eGFR has been calculated using the CKD EPI equation. This calculation has not been validated in all clinical situations. eGFR's persistently <60 mL/min signify possible Chronic Kidney Disease.   . Anion gap 05/16/2017 7  5 - 15 Final  . Ferritin 05/16/2017 44  11 - 307 ng/mL Final  . Vitamin B-12 05/16/2017 2917* 180 - 914 pg/mL Final   Comment: (NOTE) This assay is not validated for testing neonatal or myeloproliferative syndrome specimens for Vitamin B12 levels. Performed at [facility], [place] [address] [place], [place] [address]     Assessment:  Jessica Hammond is a 56 y.o. female with stage I right breast cancer s/p lumpectomy and sentinel lymph node biopsy in 10/2008. 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 Pathologic stage was T1aN0. Tumor  was ER positive, PR positive and HER-2 negative.  She underwent bilateral mastectomy and TRAM flap reconstruction on 02/03/2009 for positive margins.  She received 2 years of tamoxifen and then was switched to Aromasin in 04/2011. She discontinued Aromasin on 07/15/2014.  CA 27.29 has been followed: 17.4 in 09/22/2010, 18.7 on 12/24/2011, 14.3 on 06/25/2012, 15.5 on 01/14/2013, 17.4 on 07/15/2013, 5.2 on 07/15/2014, 17.2 on 07/20/2016, and 18.7 on 05/16/2017.  She receives mammograms yearly per Dr. Edwin Dada suggestion. She underwent mammogram at the mobile unit in 01/2016 in [place].   She is s/p gastric bypass surgery on 08/17/2013 and subsequent iron deficiency.  Labs on 07/20/2016 revealed a ferritin 5 with a saturation of 7% and a TIBC of 505 (high).  B12 was 4249.  SPEP revealed no monoclonal protein.    She received Venofer 4 (11/21, 12/5, 12/12, and 09/18/2016).  She receives Venofer if her ferritin is < 30.  She is on oral B12 supplementation.  B12 was 2917 on 05/16/2017.  Ferritin has been followed:  5 on 07/20/2016, 68 on 11/16/2016, and 44 on 05/16/2017.  EGD on 08/03/2016 was normal.  Colonscopy on 08/03/2016 was normal except for 3 mm sessile polyp in the cecum. Pathology revealed melanosis coli negative for dysplasia or malignancy.  Capsule endoscopy revealed no abnormality.  Symptomatically, patient feels fine. She denies any physical complaints today.  Exam reveals stable post operative changes. Hematocrit is 35.8, hemoglobin 12.6, and ferritin 44.  B12 is 2917.  Plan: 1. Review labs from 05/16/2017.  Hematocrit and MCV are normal.  Iron stores are adequate. 2. No Venofer today. 3. Discuss gastric bypass and iron deficiency anemia.  Discuss monitoring ferritin and providing IV iron as needed.  Goal ferritin 100. Will need intravenous iron when ferritin < 30 or she becomes symptomatic.  4. Review diagnosis, staging, and management of breast cancer.  Discuss plan for yearly  follow-up.  Discuss mammogram controversy.  Discuss plan to follow-up with Dr. Tula Nakayama regarding need for bilateral mammogram (? residual breast tissue). RN to follow up with Dr. Edwin Dada office as [facility] advising that they will not perform a mammogram. 5.  RTC in 3 months  for labs (CBC with diff, ferritin) 6.  RTC in 6 months for MD assessment and labs (CBC with diff, CMP, ferritin, B12, CA27.29 - day before) and +/- Venofer.   Melissa C. Mike Gip, MD  05/17/2017, 2:06 PM

## 2017-08-16 ENCOUNTER — Inpatient Hospital Stay: Payer: BLUE CROSS/BLUE SHIELD | Attending: Hematology and Oncology

## 2017-08-16 DIAGNOSIS — D508 Other iron deficiency anemias: Secondary | ICD-10-CM

## 2017-08-16 DIAGNOSIS — D509 Iron deficiency anemia, unspecified: Secondary | ICD-10-CM | POA: Diagnosis not present

## 2017-08-16 DIAGNOSIS — C50911 Malignant neoplasm of unspecified site of right female breast: Secondary | ICD-10-CM

## 2017-08-16 LAB — CBC WITH DIFFERENTIAL/PLATELET
Basophils Absolute: 0.1 10*3/uL (ref 0–0.1)
Basophils Relative: 1 %
Eosinophils Absolute: 0.1 10*3/uL (ref 0–0.7)
Eosinophils Relative: 2 %
HCT: 37.2 % (ref 35.0–47.0)
Hemoglobin: 12.5 g/dL (ref 12.0–16.0)
Lymphocytes Relative: 29 %
Lymphs Abs: 1.7 10*3/uL (ref 1.0–3.6)
MCH: 31.2 pg (ref 26.0–34.0)
MCHC: 33.6 g/dL (ref 32.0–36.0)
MCV: 92.7 fL (ref 80.0–100.0)
Monocytes Absolute: 0.5 10*3/uL (ref 0.2–0.9)
Monocytes Relative: 9 %
Neutro Abs: 3.5 10*3/uL (ref 1.4–6.5)
Neutrophils Relative %: 59 %
Platelets: 344 10*3/uL (ref 150–440)
RBC: 4.01 MIL/uL (ref 3.80–5.20)
RDW: 12.9 % (ref 11.5–14.5)
WBC: 5.9 10*3/uL (ref 3.6–11.0)

## 2017-08-16 LAB — FERRITIN: Ferritin: 32 ng/mL (ref 11–307)

## 2017-08-20 DIAGNOSIS — E538 Deficiency of other specified B group vitamins: Secondary | ICD-10-CM | POA: Insufficient documentation

## 2017-08-28 ENCOUNTER — Telehealth: Payer: Self-pay | Admitting: Urgent Care

## 2017-08-28 NOTE — Telephone Encounter (Signed)
Communication received from Dr. Ammie Ferrier office regarding need for patient to have a mammogram. She is s/p bilateral mastectomy, however still has residual breast tissue. I discussed this with primary oncologist Mike Gip, MD), who advised that patient does not need a mammogram. LMOM for patient to return call to the clinic should any questions or concerns arise.

## 2017-11-07 ENCOUNTER — Other Ambulatory Visit: Payer: Self-pay | Admitting: Internal Medicine

## 2017-11-07 DIAGNOSIS — N6310 Unspecified lump in the right breast, unspecified quadrant: Secondary | ICD-10-CM

## 2017-11-08 ENCOUNTER — Ambulatory Visit
Admission: RE | Admit: 2017-11-08 | Discharge: 2017-11-08 | Disposition: A | Discharge status: Home / Self Care | Payer: BLUE CROSS/BLUE SHIELD | Source: Ambulatory Visit | Attending: Internal Medicine | Admitting: Internal Medicine

## 2017-11-08 DIAGNOSIS — N6313 Unspecified lump in the right breast, lower outer quadrant: Secondary | ICD-10-CM | POA: Insufficient documentation

## 2017-11-08 DIAGNOSIS — N6310 Unspecified lump in the right breast, unspecified quadrant: Secondary | ICD-10-CM | POA: Diagnosis present

## 2017-11-22 ENCOUNTER — Inpatient Hospital Stay: Payer: BLUE CROSS/BLUE SHIELD

## 2017-11-22 ENCOUNTER — Inpatient Hospital Stay: Payer: BLUE CROSS/BLUE SHIELD | Attending: Hematology and Oncology

## 2017-11-22 ENCOUNTER — Encounter: Payer: Self-pay | Admitting: Hematology and Oncology

## 2017-11-22 ENCOUNTER — Inpatient Hospital Stay (HOSPITAL_BASED_OUTPATIENT_CLINIC_OR_DEPARTMENT_OTHER): Payer: BLUE CROSS/BLUE SHIELD | Admitting: Hematology and Oncology

## 2017-11-22 ENCOUNTER — Other Ambulatory Visit: Payer: Self-pay

## 2017-11-22 ENCOUNTER — Other Ambulatory Visit: Payer: Self-pay | Admitting: Hematology and Oncology

## 2017-11-22 VITALS — BP 118/72 | HR 79 | Temp 98.1°F | Resp 18 | Wt 161.2 lb

## 2017-11-22 DIAGNOSIS — C50911 Malignant neoplasm of unspecified site of right female breast: Secondary | ICD-10-CM | POA: Diagnosis present

## 2017-11-22 DIAGNOSIS — Z9884 Bariatric surgery status: Secondary | ICD-10-CM | POA: Diagnosis not present

## 2017-11-22 DIAGNOSIS — D508 Other iron deficiency anemias: Secondary | ICD-10-CM

## 2017-11-22 DIAGNOSIS — D509 Iron deficiency anemia, unspecified: Secondary | ICD-10-CM

## 2017-11-22 DIAGNOSIS — N6001 Solitary cyst of right breast: Secondary | ICD-10-CM | POA: Diagnosis not present

## 2017-11-22 DIAGNOSIS — K9589 Other complications of other bariatric procedure: Secondary | ICD-10-CM

## 2017-11-22 LAB — COMPREHENSIVE METABOLIC PANEL
ALT: 22 U/L (ref 14–54)
AST: 24 U/L (ref 15–41)
Albumin: 4.1 g/dL (ref 3.5–5.0)
Alkaline Phosphatase: 49 U/L (ref 38–126)
Anion gap: 7 (ref 5–15)
BUN: 24 mg/dL — ABNORMAL HIGH (ref 6–20)
CO2: 27 mmol/L (ref 22–32)
Calcium: 9.3 mg/dL (ref 8.9–10.3)
Chloride: 102 mmol/L (ref 101–111)
Creatinine, Ser: 0.98 mg/dL (ref 0.44–1.00)
GFR calc Af Amer: >60 mL/min (ref >60)
GFR calc non Af Amer: >60 mL/min (ref >60)
Glucose, Bld: 107 mg/dL — ABNORMAL HIGH (ref 65–99)
Potassium: 3.9 mmol/L (ref 3.5–5.1)
Sodium: 136 mmol/L (ref 135–145)
Total Bilirubin: 0.4 mg/dL (ref 0.3–1.2)
Total Protein: 7.4 g/dL (ref 6.5–8.1)

## 2017-11-22 LAB — CBC WITH DIFFERENTIAL/PLATELET
Basophils Absolute: 0.1 10*3/uL (ref 0–0.1)
Basophils Relative: 1 %
Eosinophils Absolute: 0.1 10*3/uL (ref 0–0.7)
Eosinophils Relative: 2 %
HCT: 35.5 % (ref 35.0–47.0)
Hemoglobin: 12.1 g/dL (ref 12.0–16.0)
Lymphocytes Relative: 32 %
Lymphs Abs: 2.1 10*3/uL (ref 1.0–3.6)
MCH: 31.4 pg (ref 26.0–34.0)
MCHC: 34 g/dL (ref 32.0–36.0)
MCV: 92.3 fL (ref 80.0–100.0)
Monocytes Absolute: 0.5 10*3/uL (ref 0.2–0.9)
Monocytes Relative: 8 %
Neutro Abs: 3.8 10*3/uL (ref 1.4–6.5)
Neutrophils Relative %: 57 %
Platelets: 353 10*3/uL (ref 150–440)
RBC: 3.85 MIL/uL (ref 3.80–5.20)
RDW: 12.9 % (ref 11.5–14.5)
WBC: 6.7 10*3/uL (ref 3.6–11.0)

## 2017-11-22 LAB — FOLATE: Folate: 32 ng/mL (ref >5.9)

## 2017-11-22 LAB — FERRITIN: Ferritin: 31 ng/mL (ref 11–307)

## 2017-11-22 LAB — VITAMIN B12: Vitamin B-12: 2122 pg/mL — ABNORMAL HIGH (ref 180–914)

## 2017-11-22 NOTE — Progress Notes (Signed)
[facility] day:  11/22/2017  Chief Complaint: Jessica Hammond is a 57 y.o. female s/p gastric bypass surgery, iron deficiency, and stage I right breast cancer who is seen for 6 month assessment.  HPI: The patient was last seen in the medical oncology clinic on 05/17/2017.  At that time, she felt fine. Exam revealed stable post operative changes. Hematocrit was 35.8, hemoglobin 12.6, and ferritin 44.  B12 was 2917.  CBC on 08/16/2017 revealed a hematocrit of 37.2, hemoglobin 12.5, and MCV 92.7.  Ferritin was 32.  During the interim, patient has been doing well. She denies any acute concerns. Her energy is "ok". Patient denies bleeding; no hematochezia, melena, or gross hematuria. Patient is eating well. She is making a conscious effort to eat iron dense foods. Her weight is up 6 pounds. She denies ice pica and restless legs symptoms.   Patient notes that her RIGHT breast incision has been draining. She has been seen by PCP and plastic surgeon.   Mammogram and ultrasound on 11/08/2017 revealed 2 adjacent oil cyst at 9:30 o'clock in the right breast 8 cm from the nipple.  There was fat necrosis beneath the open skin wound at the right breast at 9:30 o'clock 4 cm from the nipple.  Patient denies pain in the clinic today.    Past Medical History:  Diagnosis Date  . Cancer ([place]) 09/22/2008   rt breast  . GERD (gastroesophageal reflux disease)   . Hyperlipidemia   . Hypertension   . Pterygium eye, right   . Seasonal allergies     Past Surgical History:  Procedure Laterality Date  . ABDOMINAL HYSTERECTOMY     still has cervix  . AUGMENTATION MAMMAPLASTY  02/03/2009   tranflap  . BREAST EXCISIONAL BIOPSY Right 09/22/2008   lumpectomy +  . CHOLECYSTECTOMY    . COLONOSCOPY  2013  . COLONOSCOPY WITH PROPOFOL N/A 08/03/2016   Procedure: COLONOSCOPY WITH PROPOFOL;  Surgeon: [name], MD;  Location: [facility] END[facility]COPY;  Service: Endoscopy;  Laterality:  N/A;  . ESOPHAGOGASTRODUODENOSCOPY (EGD) WITH PROPOFOL N/A 08/03/2016   Procedure: ESOPHAGOGASTRODUODENOSCOPY (EGD) WITH PROPOFOL;  Surgeon: [name], MD;  Location: [facility] ENDOSCOPY;  Service: Endoscopy;  Laterality: N/A;  . GIVENS CAPSULE STUDY N/A 08/14/2016   Procedure: GIVENS CAPSULE STUDY;  Surgeon: [name], MD;  Location: [facility] ENDOSCOPY;  Service: Gastroenterology;  Laterality: N/A;  . INGUINAL HERNIA REPAIR Right   . LAPAROSCOPIC CHOLECYSTECTOMY W/ CHOLANGIOGRAPHY  1999  . LAPAROSCOPIC GASTRIC BYPASS  08/17/2013  . MASTECTOMY Bilateral 02/03/2009   bilat mast with tramflap recon.  . sleep apnea surgery  2003    Family History  Problem Relation Age of Onset  . Breast cancer Mother 63  . Breast cancer Maternal Aunt 8       twin sister to mother    Social History:  reports that  has never smoked. she has never used smokeless tobacco. She reports that she drinks alcohol. She reports that she does not use drugs.  She works at [employer].  She lives in [place].  The patient is alone today.  Allergies: No Known Allergies  Current Medications: Current Outpatient Medications  Medication Sig Dispense Refill  . BIOTIN PO Take by mouth.     . Calcium Carbonate-Vitamin D (CALCIUM-VITAMIN D3 PO) Take by mouth.     . Cyanocobalamin (VITAMIN B-12) 2500 MCG SUBL Place under the tongue.     Marland Kitchen desloratadine (CLARINEX) 5 MG tablet Take 5  mg by mouth daily.     Marland Kitchen lisinopril-hydrochlorothiazide (PRINZIDE,ZESTORETIC) 10-12.5 MG tablet Take by mouth daily.     . Multiple Vitamin (MULTI-VITAMINS) TABS Take by mouth.    . sertraline (ZOLOFT) 100 MG tablet Take by mouth at bedtime.     Marland Kitchen VITAMIN E PO Take by mouth.     No current facility-administered medications for this visit.     Review of Systems:  GENERAL:  Feels "excellent".  No fevers or sweats. Weight up 6 pounds since last clinic visit.  PERFORMANCE STATUS (ECOG):  0 HEENT:  No visual changes, runny nose, sore throat, mouth sores  or tenderness. Lungs: No shortness of breath or cough.  No hemoptysis. Cardiac:  No chest pain, palpitations, orthopnea, or PND. GI:  Eating well.  No nausea, vomiting, diarrhea, constipation, melena or hematochezia.  No pica. GU:  No urgency, frequency, dysuria, or hematuria. Musculoskeletal:  No back pain.  No joint pain.  No muscle tenderness. Extremities:  No pain or swelling. Skin:  Issues with incision and reconstruction.  No rashes. Neuro:  No headache, numbness or weakness, balance or coordination issues. Endocrine:  No diabetes, thyroid issues.  Hot flashes.  No night sweats. Psych:  No mood changes, depression or anxiety. Pain:  No focal pain. Review of systems:  All other systems reviewed and found to be negative.  Physical Exam: Blood pressure 118/72, pulse 79, temperature 98.1 F (36.7 C), temperature source Tympanic, resp. rate 18, weight 161 lb 3.2 oz (73.1 kg). GENERAL:  Well developed, well nourished, woman sitting comfortably in the exam room in no acute distress. MENTAL STATUS:  Alert and oriented to person, place and time. HEAD:  Shoulder length auburn/red hair.  Normocephalic, atraumatic, face symmetric, no Cushingoid features. EYES:  Glasses.  Blue eyes.  Pupils equal round and reactive to light and accomodation.  No conjunctivitis or scleral icterus. ENT:  Oropharynx clear without lesion.  Tongue normal. Mucous membranes moist.  RESPIRATORY:  Clear to auscultation without rales, wheezes or rhonchi. CARDIOVASCULAR:  Regular rate and rhythm without murmur, rub or gallop. BREAST:  s/p bilateral mastectomy with reconstruction.  No skin changes or nodularity.  Right sided firm area at 6- 8 o'clock (old per patient).   8 x 4 mm open area at 3 o'clock position.  1 cm firm area high at the 12 o'clock position on th left (old per patient).  Fibrocystic changes. ABDOMEN:  Soft, non-tender, with active bowel sounds, and no hepatosplenomegaly.  No masses. SKIN:  No rashes, ulcers  or lesions. EXTREMITIES: No edema, no skin discoloration or tenderness.  No palpable cords. LYMPH NODES: No palpable cervical, supraclavicular, axillary or inguinal adenopathy  NEUROLOGICAL: Unremarkable. PSYCH:  Appropriate.   Appointment on 11/22/2017  Component Date Value Ref Range Status  . Sodium 11/22/2017 136  135 - 145 mmol/L Final  . Potassium 11/22/2017 3.9  3.5 - 5.1 mmol/L Final  . Chloride 11/22/2017 102  101 - 111 mmol/L Final  . CO2 11/22/2017 27  22 - 32 mmol/L Final  . Glucose, Bld 11/22/2017 107* 65 - 99 mg/dL Final  . BUN 11/22/2017 24* 6 - 20 mg/dL Final  . Creatinine, Ser 11/22/2017 0.98  0.44 - 1.00 mg/dL Final  . Calcium 11/22/2017 9.3  8.9 - 10.3 mg/dL Final  . Total Protein 11/22/2017 7.4  6.5 - 8.1 g/dL Final  . Albumin 11/22/2017 4.1  3.5 - 5.0 g/dL Final  . AST 11/22/2017 24  15 - 41 U/L Final  .  ALT 11/22/2017 22  14 - 54 U/L Final  . Alkaline Phosphatase 11/22/2017 49  38 - 126 U/L Final  . Total Bilirubin 11/22/2017 0.4  0.3 - 1.2 mg/dL Final  . GFR calc non Af Amer 11/22/2017 >60  >60 mL/min Final  . GFR calc Af Amer 11/22/2017 >60  >60 mL/min Final   Comment: (NOTE) The eGFR has been calculated using the CKD EPI equation. This calculation has not been validated in all clinical situations. eGFR's persistently <60 mL/min signify possible Chronic Kidney Disease.   Georgiann Hahn gap 11/22/2017 7  5 - 15 Final   Performed at [facility], [address] [place], [place] [address]  . WBC 11/22/2017 6.7  3.6 - 11.0 K/uL Final  . RBC 11/22/2017 3.85  3.80 - 5.20 MIL/uL Final  . Hemoglobin 11/22/2017 12.1  12.0 - 16.0 g/dL Final  . HCT 11/22/2017 35.5  35.0 - 47.0 % Final  . MCV 11/22/2017 92.3  80.0 - 100.0 fL Final  . MCH 11/22/2017 31.4  26.0 - 34.0 pg Final  . MCHC 11/22/2017 34.0  32.0 - 36.0 g/dL Final  . RDW 11/22/2017 12.9  11.5 - 14.5 % Final  . Platelets 11/22/2017 353  150 - 440 K/uL Final  . Neutrophils Relative % 11/22/2017 57  % Final   . Neutro Abs 11/22/2017 3.8  1.4 - 6.5 K/uL Final  . Lymphocytes Relative 11/22/2017 32  % Final  . Lymphs Abs 11/22/2017 2.1  1.0 - 3.6 K/uL Final  . Monocytes Relative 11/22/2017 8  % Final  . Monocytes Absolute 11/22/2017 0.5  0.2 - 0.9 K/uL Final  . Eosinophils Relative 11/22/2017 2  % Final  . Eosinophils Absolute 11/22/2017 0.1  0 - 0.7 K/uL Final  . Basophils Relative 11/22/2017 1  % Final  . Basophils Absolute 11/22/2017 0.1  0 - 0.1 K/uL Final   Performed at [facility], [address], [place], [place] [address]    Assessment:  JADY BRAGGS is a 57 y.o. female with stage I right breast cancer s/p lumpectomy and sentinel lymph node biopsy in 10/2008. 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 Pathologic stage was T1aN0. Tumor was ER positive, PR positive and HER-2 negative.  She underwent bilateral mastectomy and TRAM flap reconstruction on 02/03/2009 for positive margins.  She received 2 years of tamoxifen and then was switched to Aromasin in 04/2011. She discontinued Aromasin on 07/15/2014.  CA 27.29 has been followed: 17.4 in 09/22/2010, 18.7 on 12/24/2011, 14.3 on 06/25/2012, 15.5 on 01/14/2013, 17.4 on 07/15/2013, 5.2 on 07/15/2014, 17.2 on 07/20/2016, 18.7 on 05/16/2017, and 16.6 on 11/22/2017.  She receives mammograms yearly per Dr. Edwin Dada suggestion. Bilateral mammogram and right sided ultrasound on 11/08/2017 revealed 2 adjacent oil cyst at 9:30 o'clock right breast 8 cm from nipple. There was fat necrosis beneath the open skin wound area at the right breast 9:30 o'clock 4 cm from nipple.  Exam revealed redness and skin breakdown in the lateral right breast at the 9 o'clock position.  She is s/p gastric bypass surgery on 08/17/2013 and subsequent iron deficiency.  Labs on 07/20/2016 revealed a ferritin 5 with a saturation of 7% and a TIBC of 505 (high).  B12 was 4249.  SPEP  revealed no monoclonal protein.    She received Venofer 4 (11/21, 12/5, 12/12, and 09/18/2016).  She receives Venofer if her ferritin is < 30.  She is on oral B12 supplementation.  B12 was 2917 on 05/16/2017.  Ferritin has been followed:  5 on 07/20/2016, 68 on 11/16/2016, 44 on 05/16/2017, 32 on 08/16/2017, and 31 on 11/22/2017.  EGD on 08/03/2016 was normal.  Colonscopy on 08/03/2016 was normal except for 3 mm sessile polyp in the cecum. Pathology revealed melanosis coli negative for dysplasia or malignancy.  Capsule endoscopy revealed no abnormality.  Symptomatically, she feels fine. She has been having drainage from her RIGHT breast. She is followed by her plastic surgeon.  Exam reveals stable post operative changes. Patient has an 8 x 4 mm open area on her RIGHT breast.  Hematocrit is 35.5, hemoglobin 12.1. Ferritin is 31.   Plan: 1. Labs today: CBC with diff, CMP, ferritin, B12, folate, CA27.29. 2. Discuss need for future mammograms. Plastic surgeon advises that there is residual breast tissue, therefore she will require annual exams. Dr. Tula Nakayama to order subsequent exams.  3. Discuss gastric bypass and iron deficiency anemia.  Discuss monitoring ferritin and providing IV iron as needed.  Goal ferritin 100. Will need intravenous iron when ferritin < 30 or she becomes symptomatic. Ferritin pending today (later returned 31). Will call patient with results and appointments for IV iron if needed.  4.  RTC in 3 months for labs (CBC with diff, ferritin) 5.  RTC in 6 months for MD assessment and labs (CBC with diff, CMP, ferritin, CA27.29 - day before) and +/- Venofer.   Honor Loh, NP  11/22/2017, 1:59 PM   I saw and evaluated the patient, participating in the key portions of the service and reviewing pertinent diagnostic studies and records.  I reviewed the nurse practitioner's note and agree with the findings and the plan.  Multiple questions were asked by the patient and answered.   Nolon Stalls, MD 11/22/2017,1:59 PM

## 2017-11-22 NOTE — Progress Notes (Signed)
Here for follow up. Over all stated "doing good "

## 2017-11-23 LAB — CANCER ANTIGEN 27.29: CA 27.29: 16.6 U/mL (ref 0.0–38.6)

## 2018-02-19 ENCOUNTER — Inpatient Hospital Stay: Payer: BLUE CROSS/BLUE SHIELD | Attending: Hematology and Oncology

## 2018-05-22 ENCOUNTER — Inpatient Hospital Stay: Payer: BLUE CROSS/BLUE SHIELD | Attending: Hematology and Oncology

## 2018-05-22 ENCOUNTER — Other Ambulatory Visit: Payer: Self-pay | Admitting: *Deleted

## 2018-05-22 ENCOUNTER — Other Ambulatory Visit: Payer: Self-pay

## 2018-05-22 DIAGNOSIS — K9589 Other complications of other bariatric procedure: Secondary | ICD-10-CM

## 2018-05-22 DIAGNOSIS — C50911 Malignant neoplasm of unspecified site of right female breast: Secondary | ICD-10-CM | POA: Diagnosis not present

## 2018-05-22 DIAGNOSIS — D509 Iron deficiency anemia, unspecified: Secondary | ICD-10-CM

## 2018-05-22 LAB — CBC WITH DIFFERENTIAL/PLATELET
Basophils Absolute: 0.1 10*3/uL (ref 0–0.1)
Basophils Relative: 1 %
Eosinophils Absolute: 0.1 10*3/uL (ref 0–0.7)
Eosinophils Relative: 2 %
HCT: 35.5 % (ref 35.0–47.0)
Hemoglobin: 12.1 g/dL (ref 12.0–16.0)
Lymphocytes Relative: 31 %
Lymphs Abs: 1.7 10*3/uL (ref 1.0–3.6)
MCH: 31.1 pg (ref 26.0–34.0)
MCHC: 34.2 g/dL (ref 32.0–36.0)
MCV: 90.9 fL (ref 80.0–100.0)
Monocytes Absolute: 0.5 10*3/uL (ref 0.2–0.9)
Monocytes Relative: 9 %
Neutro Abs: 3.2 10*3/uL (ref 1.4–6.5)
Neutrophils Relative %: 57 %
Platelets: 333 10*3/uL (ref 150–440)
RBC: 3.91 MIL/uL (ref 3.80–5.20)
RDW: 13.2 % (ref 11.5–14.5)
WBC: 5.7 10*3/uL (ref 3.6–11.0)

## 2018-05-22 LAB — COMPREHENSIVE METABOLIC PANEL
ALT: 20 U/L (ref 0–44)
AST: 20 U/L (ref 15–41)
Albumin: 4.2 g/dL (ref 3.5–5.0)
Alkaline Phosphatase: 48 U/L (ref 38–126)
Anion gap: 10 (ref 5–15)
BUN: 23 mg/dL — ABNORMAL HIGH (ref 6–20)
CO2: 26 mmol/L (ref 22–32)
Calcium: 9.7 mg/dL (ref 8.9–10.3)
Chloride: 103 mmol/L (ref 98–111)
Creatinine, Ser: 0.91 mg/dL (ref 0.44–1.00)
GFR calc Af Amer: >60 mL/min (ref >60)
GFR calc non Af Amer: >60 mL/min (ref >60)
Glucose, Bld: 75 mg/dL (ref 70–99)
Potassium: 3.9 mmol/L (ref 3.5–5.1)
Sodium: 139 mmol/L (ref 135–145)
Total Bilirubin: 0.5 mg/dL (ref 0.3–1.2)
Total Protein: 7.3 g/dL (ref 6.5–8.1)

## 2018-05-22 LAB — FERRITIN: Ferritin: 23 ng/mL (ref 11–307)

## 2018-05-23 ENCOUNTER — Inpatient Hospital Stay: Payer: BLUE CROSS/BLUE SHIELD

## 2018-05-23 ENCOUNTER — Inpatient Hospital Stay (HOSPITAL_BASED_OUTPATIENT_CLINIC_OR_DEPARTMENT_OTHER): Payer: BLUE CROSS/BLUE SHIELD | Admitting: Hematology and Oncology

## 2018-05-23 ENCOUNTER — Encounter: Payer: Self-pay | Admitting: Hematology and Oncology

## 2018-05-23 VITALS — BP 111/78 | HR 76 | Temp 98.0°F | Resp 18 | Wt 163.6 lb

## 2018-05-23 VITALS — BP 109/76 | HR 69 | Resp 18

## 2018-05-23 DIAGNOSIS — Z9884 Bariatric surgery status: Secondary | ICD-10-CM

## 2018-05-23 DIAGNOSIS — D508 Other iron deficiency anemias: Secondary | ICD-10-CM

## 2018-05-23 DIAGNOSIS — Z853 Personal history of malignant neoplasm of breast: Secondary | ICD-10-CM | POA: Diagnosis not present

## 2018-05-23 DIAGNOSIS — C50911 Malignant neoplasm of unspecified site of right female breast: Secondary | ICD-10-CM

## 2018-05-23 DIAGNOSIS — R5383 Other fatigue: Secondary | ICD-10-CM

## 2018-05-23 DIAGNOSIS — Z9013 Acquired absence of bilateral breasts and nipples: Secondary | ICD-10-CM

## 2018-05-23 DIAGNOSIS — D509 Iron deficiency anemia, unspecified: Secondary | ICD-10-CM

## 2018-05-23 LAB — CA 27.29 (SERIAL MONITOR): CA 27.29: 14.6 U/mL (ref 0.0–38.6)

## 2018-05-23 MED ORDER — SODIUM CHLORIDE 0.9 % IV SOLN
Freq: Once | INTRAVENOUS | Status: AC
  Administered 2018-05-23: 12:00:00 via INTRAVENOUS
  Filled 2018-05-23: qty 250

## 2018-05-23 MED ORDER — IRON SUCROSE 20 MG/ML IV SOLN
200.0000 mg | Freq: Once | INTRAVENOUS | Status: AC
  Administered 2018-05-23: 200 mg via INTRAVENOUS
  Filled 2018-05-23: qty 10

## 2018-05-23 MED ORDER — SODIUM CHLORIDE 0.9 % IV SOLN: 200.0000 mg | Freq: Once | INTRAVENOUS | Status: DC

## 2018-05-23 NOTE — Progress Notes (Signed)
Patient offers no complaints today. 

## 2018-05-23 NOTE — Progress Notes (Signed)
[facility] day:  05/23/2018  Chief Complaint: Jessica Hammond is a 57 y.o. female s/p gastric bypass surgery, iron deficiency, and stage I right breast cancer who is seen for 6 month assessment.  HPI: The patient was last seen in the medical oncology clinic on 11/22/2017.  At that time, she felt fine. She had drainage from her RIGHT breast. She was followed by her plastic surgeon.  Exam revealed stable post operative changes. Patient had an 8 x 4 mm open area on her RIGHT breast.  Hematocrit was 35.5, hemoglobin 12.1. Ferritin was 31.  B12 was 2122.  Folate was 32.  CA27.29 was 16.6.  CBC on 05/22/2018 revealed a hematocrit of 35.5, hemoglobin 12.1, and MCV 90.9.  Ferritin was 23.  During the interim, she has felt good.  She notes a little fatigue.  She denies any pica.  She denies any bleeding.   Past Medical History:  Diagnosis Date  . Cancer ([place]) 09/22/2008   rt breast  . GERD (gastroesophageal reflux disease)   . Hyperlipidemia   . Hypertension   . Pterygium eye, right   . Seasonal allergies     Past Surgical History:  Procedure Laterality Date  . ABDOMINAL HYSTERECTOMY     still has cervix  . AUGMENTATION MAMMAPLASTY  02/03/2009   tranflap  . BREAST EXCISIONAL BIOPSY Right 09/22/2008   lumpectomy +  . CHOLECYSTECTOMY    . COLONOSCOPY  2013  . COLONOSCOPY WITH PROPOFOL N/A 08/03/2016   Procedure: COLONOSCOPY WITH PROPOFOL;  Surgeon: [name], MD;  Location: [facility] ENDOSCOPY;  Service: Endoscopy;  Laterality: N/A;  . ESOPHAGOGASTRODUODENOSCOPY (EGD) WITH PROPOFOL N/A 08/03/2016   Procedure: ESOPHAGOGASTRODUODENOSCOPY (EGD) WITH PROPOFOL;  Surgeon: [name], MD;  Location: [facility] END[facility]COPY;  Service: Endoscopy;  Laterality: N/A;  . GIVENS CAPSULE STUDY N/A 08/14/2016   Procedure: GIVENS CAPSULE STUDY;  Surgeon: [name], MD;  Location: [facility] ENDOSCOPY;  Service: Gastroenterology;  Laterality: N/A;  . INGUINAL HERNIA REPAIR Right   .  LAPAROSCOPIC CHOLECYSTECTOMY W/ CHOLANGIOGRAPHY  1999  . LAPAROSCOPIC GASTRIC BYPASS  08/17/2013  . MASTECTOMY Bilateral 02/03/2009   bilat mast with tramflap recon.  . sleep apnea surgery  2003    Family History  Problem Relation Age of Onset  . Breast cancer Mother 72  . Breast cancer Maternal Aunt 43       twin sister to mother    Social History:  reports that she has never smoked. She has never used smokeless tobacco. She reports that she drinks alcohol. She reports that she does not use drugs.  She works at [employer].  She lives in [place].  The patient is alone today.  Allergies: No Known Allergies  Current Medications: Current Outpatient Medications  Medication Sig Dispense Refill  . BIOTIN PO Take by mouth.     . Calcium Carbonate-Vitamin D (CALCIUM-VITAMIN D3 PO) Take by mouth.     . Cyanocobalamin (VITAMIN B-12) 2500 MCG SUBL Place under the tongue.     Marland Kitchen desloratadine (CLARINEX) 5 MG tablet Take 5 mg by mouth daily.     Marland Kitchen lisinopril-hydrochlorothiazide (PRINZIDE,ZESTORETIC) 10-12.5 MG tablet Take by mouth daily.     . Multiple Vitamin (MULTI-VITAMINS) TABS Take by mouth.    . sertraline (ZOLOFT) 100 MG tablet Take by mouth at bedtime.     Marland Kitchen VITAMIN E PO Take by mouth.     No current facility-administered medications for this visit.     Review  of Systems:  GENERAL:  Feels good.  Little fatigue.  No fevers, sweats.  Weight up 2 pounds. PERFORMANCE STATUS (ECOG):  0 HEENT:  No visual changes, runny nose, sore throat, mouth sores or tenderness. Lungs: No shortness of breath or cough.  No hemoptysis. Cardiac:  No chest pain, palpitations, orthopnea, or PND. GI:  No nausea, vomiting, diarrhea, constipation, melena or hematochezia.  No pica. GU:  No urgency, frequency, dysuria, or hematuria. Musculoskeletal:  No back pain.  No joint pain.  No muscle tenderness. Extremities:  No pain or swelling. Skin:  No rashes or skin changes.  Less drainage then stopped over last  3-4 weeks. Neuro:  No headache, numbness or weakness, balance or coordination issues. Endocrine:  No diabetes, thyroid issues, hot flashes or night sweats. Psych:  No mood changes, depression or anxiety. Pain:  No focal pain. Review of systems:  All other systems reviewed and found to be negative.   Physical Exam: Blood pressure 111/78, pulse 76, temperature 98 F (36.7 C), temperature source Tympanic, resp. rate 18, weight 163 lb 9 oz (74.2 kg), SpO2 98 %. GENERAL:  Well developed, well nourished, woman sitting comfortably in the exam room in no acute distress. MENTAL STATUS:  Alert and oriented to person, place and time. HEAD:  Shoulder length blonde hair.  Normocephalic, atraumatic, face symmetric, no Cushingoid features. EYES:  Glasses.  Blue eyes.  Pupils equal round and reactive to light and accomodation.  No conjunctivitis or scleral icterus. ENT:  Oropharynx clear without lesion.  Tongue normal. Mucous membranes moist.  RESPIRATORY:  Clear to auscultation without rales, wheezes or rhonchi. CARDIOVASCULAR:  Regular rate and rhythm without murmur, rub or gallop. ABDOMEN:  Soft, non-tender, with active bowel sounds, and no hepatosplenomegaly.  No masses. SKIN:  No rashes, ulcers or lesions. EXTREMITIES: No edema, no skin discoloration or tenderness.  No palpable cords. LYMPH NODES: No palpable cervical, supraclavicular, axillary or inguinal adenopathy  NEUROLOGICAL: Unremarkable. PSYCH:  Appropriate.    Orders Only on 05/22/2018  Component Date Value Ref Range Status  . Ferritin 05/22/2018 23  11 - 307 ng/mL Final   Performed at [facility], [address] [place], [place] [address]  . Sodium 05/22/2018 139  135 - 145 mmol/L Final  . Potassium 05/22/2018 3.9  3.5 - 5.1 mmol/L Final  . Chloride 05/22/2018 103  98 - 111 mmol/L Final  . CO2 05/22/2018 26  22 - 32 mmol/L Final  . Glucose, Bld 05/22/2018 75  70 - 99 mg/dL Final  . BUN 05/22/2018 23* 6 - 20 mg/dL Final   . Creatinine, Ser 05/22/2018 0.91  0.44 - 1.00 mg/dL Final  . Calcium 05/22/2018 9.7  8.9 - 10.3 mg/dL Final  . Total Protein 05/22/2018 7.3  6.5 - 8.1 g/dL Final  . Albumin 05/22/2018 4.2  3.5 - 5.0 g/dL Final  . AST 05/22/2018 20  15 - 41 U/L Final  . ALT 05/22/2018 20  0 - 44 U/L Final  . Alkaline Phosphatase 05/22/2018 48  38 - 126 U/L Final  . Total Bilirubin 05/22/2018 0.5  0.3 - 1.2 mg/dL Final  . GFR calc non Af Amer 05/22/2018 >60  >60 mL/min Final  . GFR calc Af Amer 05/22/2018 >60  >60 mL/min Final   Comment: (NOTE) The eGFR has been calculated using the CKD EPI equation. This calculation has not been validated in all clinical situations. eGFR's persistently <60 mL/min signify possible Chronic Kidney Disease.   Jessica Hammond gap 05/22/2018  10  5 - 15 Final   Performed at [facility], [address] [place], [place] [address]  . WBC 05/22/2018 5.7  3.6 - 11.0 K/uL Final  . RBC 05/22/2018 3.91  3.80 - 5.20 MIL/uL Final  . Hemoglobin 05/22/2018 12.1  12.0 - 16.0 g/dL Final  . HCT 05/22/2018 35.5  35.0 - 47.0 % Final  . MCV 05/22/2018 90.9  80.0 - 100.0 fL Final  . MCH 05/22/2018 31.1  26.0 - 34.0 pg Final  . MCHC 05/22/2018 34.2  32.0 - 36.0 g/dL Final  . RDW 05/22/2018 13.2  11.5 - 14.5 % Final  . Platelets 05/22/2018 333  150 - 440 K/uL Final  . Neutrophils Relative % 05/22/2018 57  % Final  . Neutro Abs 05/22/2018 3.2  1.4 - 6.5 K/uL Final  . Lymphocytes Relative 05/22/2018 31  % Final  . Lymphs Abs 05/22/2018 1.7  1.0 - 3.6 K/uL Final  . Monocytes Relative 05/22/2018 9  % Final  . Monocytes Absolute 05/22/2018 0.5  0.2 - 0.9 K/uL Final  . Eosinophils Relative 05/22/2018 2  % Final  . Eosinophils Absolute 05/22/2018 0.1  0 - 0.7 K/uL Final  . Basophils Relative 05/22/2018 1  % Final  . Basophils Absolute 05/22/2018 0.1  0 - 0.1 K/uL Final   Performed at [facility], [address] [place], [place] [address]    Assessment:  GREGORY DOWE is a 57 y.o.  female with stage I right breast cancer s/p lumpectomy and sentinel lymph node biopsy in 10/2008. 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 Pathologic stage was T1aN0. Tumor was ER positive, PR positive and HER-2 negative.  She underwent bilateral mastectomy and TRAM flap reconstruction on 02/03/2009 for positive margins.  She received 2 years of tamoxifen and then was switched to Aromasin in 04/2011. She discontinued Aromasin on 07/15/2014.  CA 27.29 has been followed: 17.4 in 09/22/2010, 18.7 on 12/24/2011, 14.3 on 06/25/2012, 15.5 on 01/14/2013, 17.4 on 07/15/2013, 5.2 on 07/15/2014, 17.2 on 07/20/2016, 18.7 on 05/16/2017, and 16.6 on 11/22/2017.  She receives mammograms yearly per Dr. Edwin Dada suggestion. Bilateral mammogram and right sided ultrasound on 11/08/2017 revealed 2 adjacent oil cyst at 9:30 o'clock right breast 8 cm from nipple. There was fat necrosis beneath the open skin wound area at the right breast 9:30 o'clock 4 cm from nipple.  Exam revealed redness and skin breakdown in the lateral right breast at the 9 o'clock position.  She is s/p gastric bypass surgery on 08/17/2013 and subsequent iron deficiency.  Labs on 07/20/2016 revealed a ferritin 5 with a saturation of 7% and a TIBC of 505 (high).  B12 was 4249.  SPEP revealed no monoclonal protein.    She received Venofer 4 (11/21, 12/5, 12/12, and 09/18/2016).  She receives Venofer if her ferritin is < 30.    Ferritin has been followed:  5 on 07/20/2016, 68 on 11/16/2016, 44 on 05/16/2017, 32 on 08/16/2017, 31 on 11/22/2017, and 23 on 05/22/2018.  She is on oral B12 supplementation.  B12 was 2917 on 05/16/2017. B12 and folate were normal on 11/22/2017.  EGD on 08/03/2016 was normal.  Colonscopy on 08/03/2016 was normal except for 3 mm sessile polyp in the cecum. Pathology revealed melanosis coli negative for dysplasia or malignancy.   Capsule endoscopy revealed no abnormality.  Symptomatically, she feels good.  Right breast drainage has stopped.  Exam is stable.  Hematocrit is 35.5, hemoglobin 12.1. Ferritin is 23.   Plan: 1. Review labs from yesterday. 2.  Breast cancer:  Doing well.  Off endocrine therapy.  No further right breast drainage (followed by plastic surgery). 3.  Iron deficiency anemia:  Hemoglobin 12.1.  MCV normal.  Iron stores drifting down.  Ferritin 23.  Venofer today and in 1 week. 4.  RTC in 3 months for labs (CBC with diff, ferritin). 5.  RTC in 6 months for MD assessment including breast exam, labs (CBC with diff, CMP, ferritin, CA27.29- day before) and +/- Venofer.   Lequita Asal, MD  05/23/2018, 11:23 AM

## 2018-05-29 ENCOUNTER — Inpatient Hospital Stay: Payer: BLUE CROSS/BLUE SHIELD

## 2018-05-29 VITALS — BP 114/77 | HR 72 | Temp 97.8°F | Resp 16

## 2018-05-29 DIAGNOSIS — Z9884 Bariatric surgery status: Secondary | ICD-10-CM

## 2018-05-29 DIAGNOSIS — D508 Other iron deficiency anemias: Secondary | ICD-10-CM

## 2018-05-29 DIAGNOSIS — C50911 Malignant neoplasm of unspecified site of right female breast: Secondary | ICD-10-CM | POA: Diagnosis not present

## 2018-05-29 MED ORDER — IRON SUCROSE 20 MG/ML IV SOLN
200.0000 mg | Freq: Once | INTRAVENOUS | Status: AC
  Administered 2018-05-29: 200 mg via INTRAVENOUS
  Filled 2018-05-29: qty 10

## 2018-05-29 MED ORDER — SODIUM CHLORIDE 0.9 % IV SOLN: 200.0000 mg | Freq: Once | INTRAVENOUS | Status: DC

## 2018-05-29 MED ORDER — SODIUM CHLORIDE 0.9 % IV SOLN
Freq: Once | INTRAVENOUS | Status: AC
  Administered 2018-05-29: 14:00:00 via INTRAVENOUS
  Filled 2018-05-29: qty 250

## 2018-08-22 ENCOUNTER — Inpatient Hospital Stay: Payer: BLUE CROSS/BLUE SHIELD | Attending: Hematology and Oncology

## 2018-08-22 ENCOUNTER — Inpatient Hospital Stay: Payer: BLUE CROSS/BLUE SHIELD

## 2018-08-22 DIAGNOSIS — C539 Malignant neoplasm of cervix uteri, unspecified: Secondary | ICD-10-CM | POA: Diagnosis not present

## 2018-08-22 DIAGNOSIS — C50911 Malignant neoplasm of unspecified site of right female breast: Secondary | ICD-10-CM

## 2018-08-22 LAB — CBC WITH DIFFERENTIAL/PLATELET
Abs Immature Granulocytes: 0.01 10*3/uL (ref 0.00–0.07)
Basophils Absolute: 0 10*3/uL (ref 0.0–0.1)
Basophils Relative: 1 %
Eosinophils Absolute: 0.1 10*3/uL (ref 0.0–0.5)
Eosinophils Relative: 2 %
HCT: 36.4 % (ref 36.0–46.0)
Hemoglobin: 11.9 g/dL — ABNORMAL LOW (ref 12.0–15.0)
Immature Granulocytes: 0 %
Lymphocytes Relative: 33 %
Lymphs Abs: 2 10*3/uL (ref 0.7–4.0)
MCH: 30 pg (ref 26.0–34.0)
MCHC: 32.7 g/dL (ref 30.0–36.0)
MCV: 91.7 fL (ref 80.0–100.0)
Monocytes Absolute: 0.5 10*3/uL (ref 0.1–1.0)
Monocytes Relative: 8 %
Neutro Abs: 3.3 10*3/uL (ref 1.7–7.7)
Neutrophils Relative %: 56 %
Platelets: 293 10*3/uL (ref 150–400)
RBC: 3.97 MIL/uL (ref 3.87–5.11)
RDW: 12 % (ref 11.5–15.5)
WBC: 5.9 10*3/uL (ref 4.0–10.5)
nRBC: 0 % (ref 0.0–0.2)

## 2018-08-22 LAB — FERRITIN: Ferritin: 90 ng/mL (ref 11–307)

## 2018-08-25 IMAGING — US US BREAST*R* LIMITED INC AXILLA
1 series · 9 of 9 positions shown · non-contrast
Comparison: Previous exam(s).

CLINICAL DATA: Personal history of right breast cancer. The patient
status post bilateral mastectomy with tram flap placement in 1454.
Patient now has focal skin breakdown of the lateral right breast.

EXAM:
2D DIGITAL DIAGNOSTIC BILATERAL MAMMOGRAM WITH CAD AND ADJUNCT TOMO
ULTRASOUND RIGHT BREAST

[Series 1: us breast*right* limited inc axilla · 0.07mm/px · 9 of 9 slices shown]
[im 1/9]
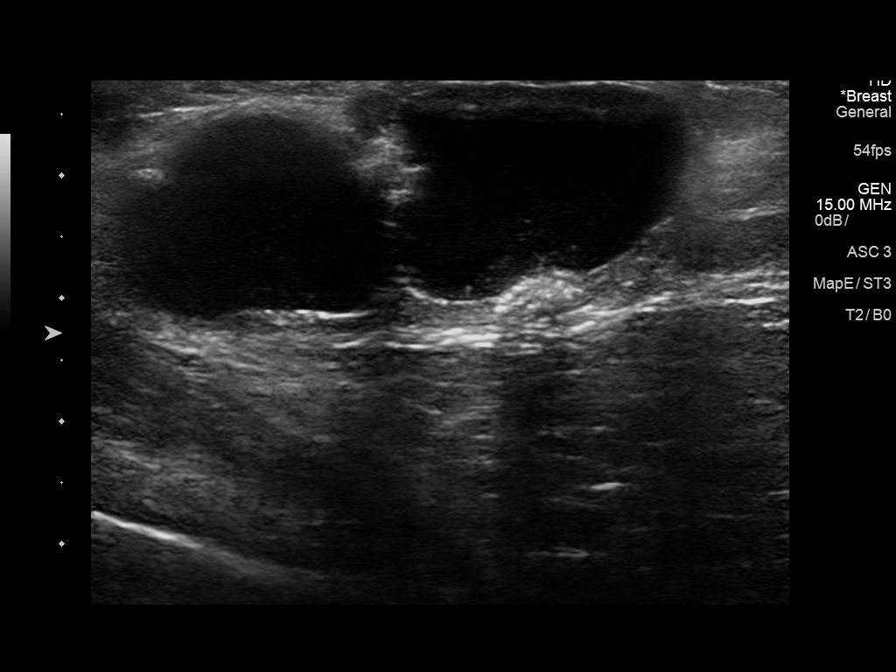
[im 2/9]
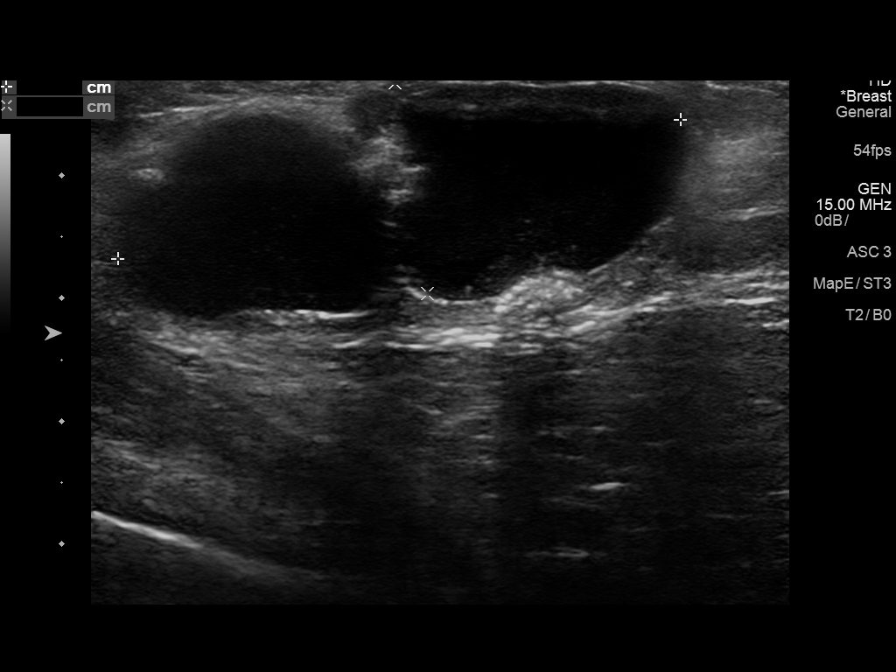
[im 3/9]
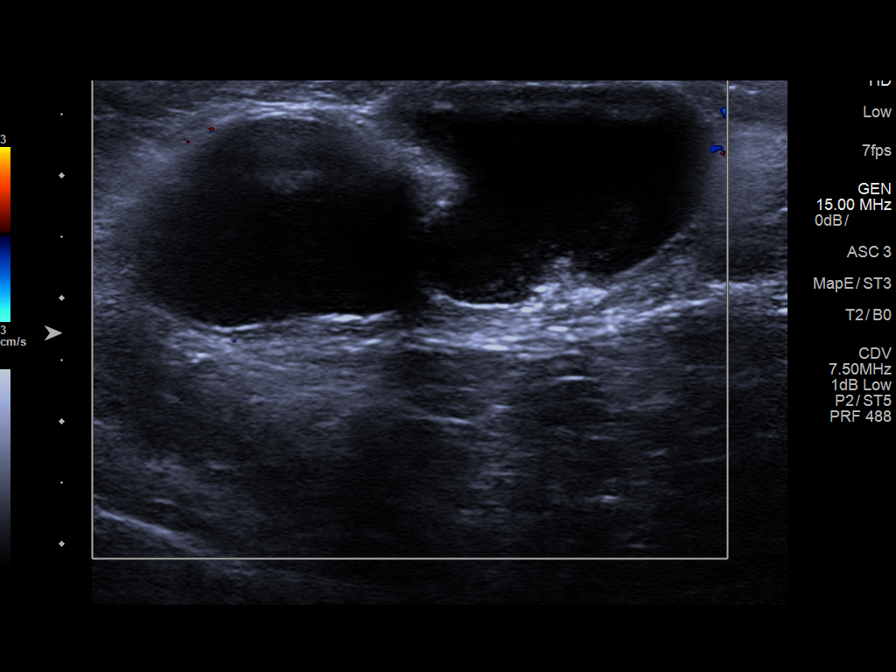
[im 4/9]
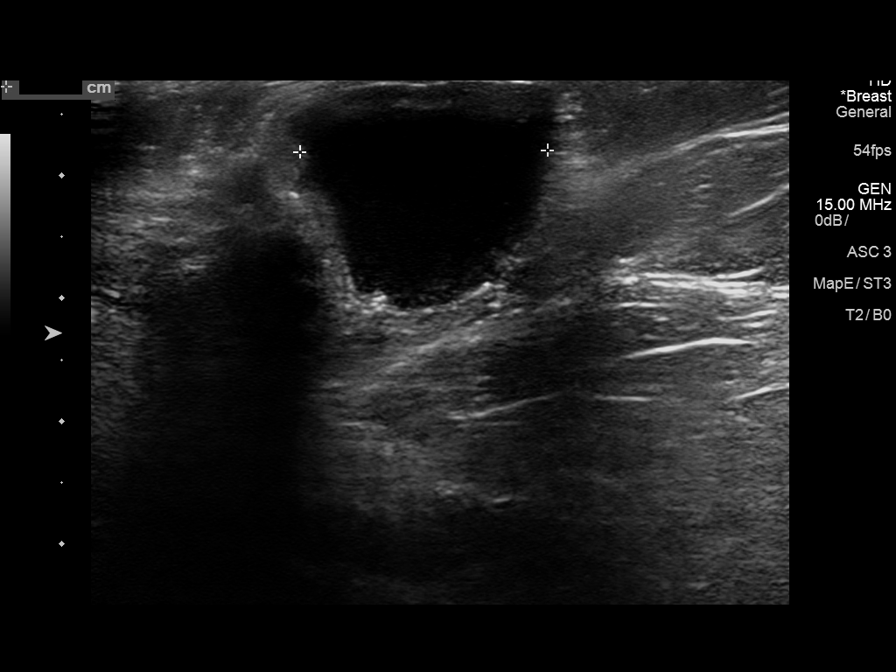
[im 5/9]
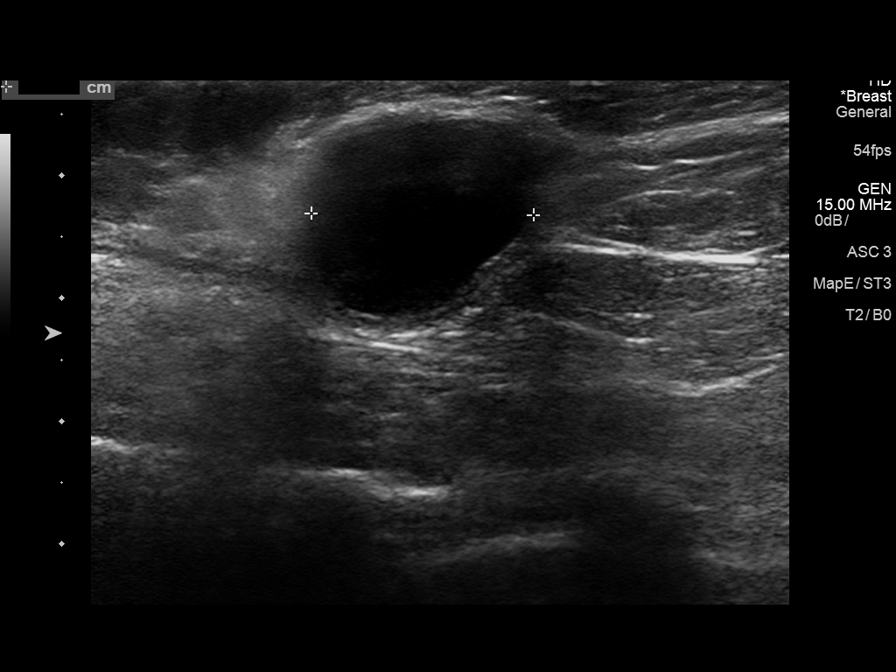
[im 6/9]
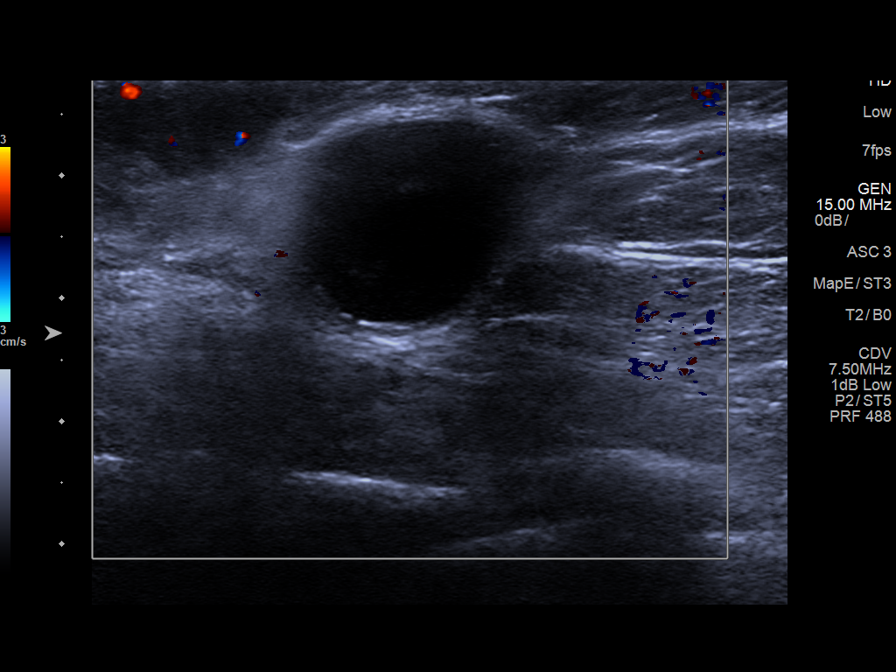
[im 7/9]
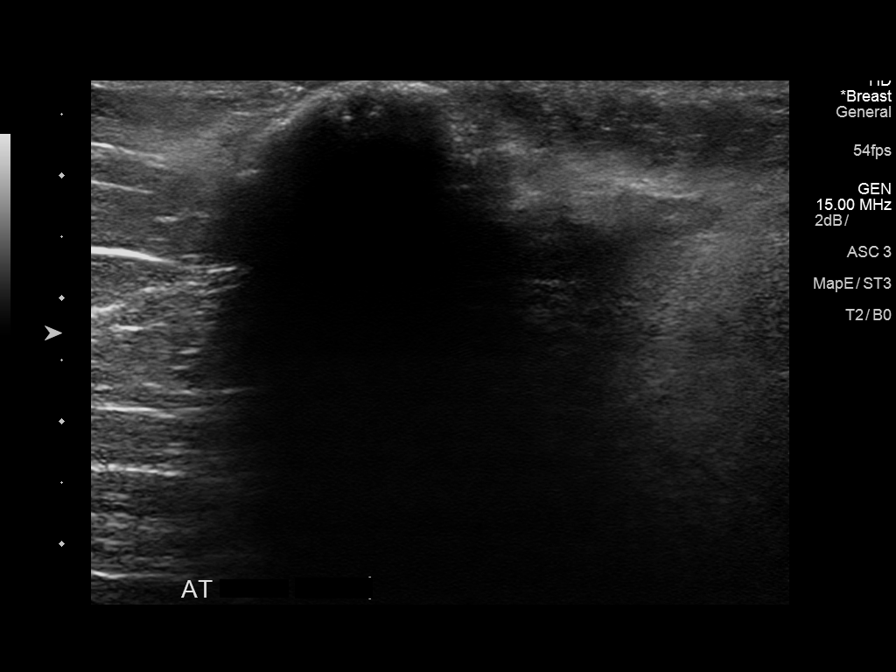
[im 8/9]
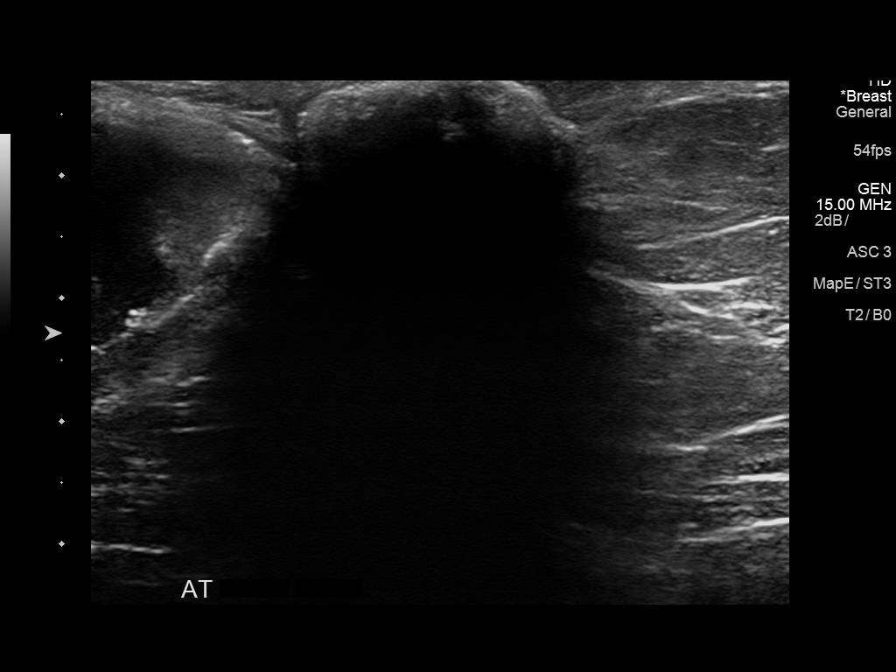
[im 9/9]
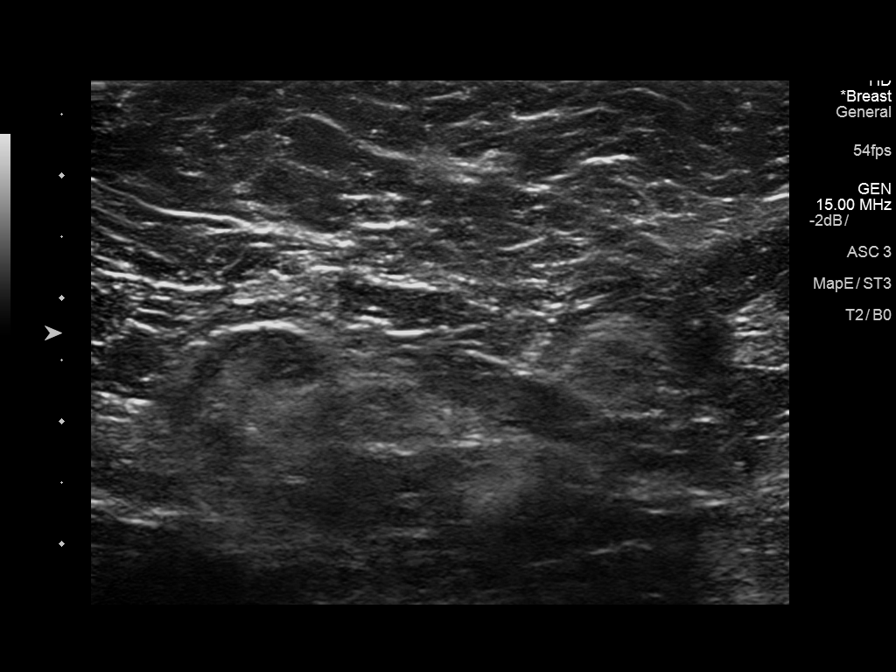

[9 of 9 positions shown; findings below may reference images not displayed]

ACR Breast Density Category b: There are scattered areas of
fibroglandular density.
FINDINGS: Cc and MLO views of bilateral breasts are submitted. Stable fat
necrosis are identified in bilateral breasts. Oil cysts are
identified within the upper right breast unchanged compared to prior
exam.

Mammographic images were processed with CAD.

On physical exam, there is redness and skin breakdown in the lateral
right breast [DATE] position.

Targeted ultrasound is performed, showing 2 adjacent oil cyst at
9:30 o'clock right breast 8 cm from nipple. There is fat necrosis
beneath the open skin wound area at the right breast 9:30 o'clock 4
cm from nipple.
IMPRESSION: Benign findings.

RECOMMENDATION:
Management on clinical basis for the patient's right breast skin
breakdown/wound.

I have discussed the findings and recommendations with the patient.
Results were also provided in writing at the conclusion of the
visit. If applicable, a reminder letter will be sent to the patient
regarding the next appointment.

BI-RADS CATEGORY  2: Benign.

## 2018-11-19 ENCOUNTER — Inpatient Hospital Stay: Payer: BLUE CROSS/BLUE SHIELD

## 2018-11-21 ENCOUNTER — Ambulatory Visit: Payer: BLUE CROSS/BLUE SHIELD

## 2018-11-21 ENCOUNTER — Ambulatory Visit: Payer: BLUE CROSS/BLUE SHIELD | Admitting: Hematology and Oncology

## 2018-12-02 ENCOUNTER — Inpatient Hospital Stay: Payer: BLUE CROSS/BLUE SHIELD | Attending: Hematology and Oncology

## 2018-12-02 DIAGNOSIS — Z9013 Acquired absence of bilateral breasts and nipples: Secondary | ICD-10-CM | POA: Insufficient documentation

## 2018-12-02 DIAGNOSIS — C50911 Malignant neoplasm of unspecified site of right female breast: Secondary | ICD-10-CM | POA: Insufficient documentation

## 2018-12-02 DIAGNOSIS — Z9884 Bariatric surgery status: Secondary | ICD-10-CM | POA: Diagnosis not present

## 2018-12-02 DIAGNOSIS — D509 Iron deficiency anemia, unspecified: Secondary | ICD-10-CM | POA: Insufficient documentation

## 2018-12-02 LAB — CBC WITH DIFFERENTIAL/PLATELET
Abs Immature Granulocytes: 0.02 10*3/uL (ref 0.00–0.07)
Basophils Absolute: 0 10*3/uL (ref 0.0–0.1)
Basophils Relative: 1 %
Eosinophils Absolute: 0.1 10*3/uL (ref 0.0–0.5)
Eosinophils Relative: 2 %
HCT: 36.7 % (ref 36.0–46.0)
Hemoglobin: 12.4 g/dL (ref 12.0–15.0)
Immature Granulocytes: 0 %
Lymphocytes Relative: 27 %
Lymphs Abs: 1.8 10*3/uL (ref 0.7–4.0)
MCH: 31.6 pg (ref 26.0–34.0)
MCHC: 33.8 g/dL (ref 30.0–36.0)
MCV: 93.4 fL (ref 80.0–100.0)
Monocytes Absolute: 0.6 10*3/uL (ref 0.1–1.0)
Monocytes Relative: 8 %
Neutro Abs: 4.2 10*3/uL (ref 1.7–7.7)
Neutrophils Relative %: 62 %
Platelets: 326 10*3/uL (ref 150–400)
RBC: 3.93 MIL/uL (ref 3.87–5.11)
RDW: 13 % (ref 11.5–15.5)
WBC: 6.7 10*3/uL (ref 4.0–10.5)
nRBC: 0 % (ref 0.0–0.2)

## 2018-12-02 LAB — COMPREHENSIVE METABOLIC PANEL
ALT: 38 U/L (ref 0–44)
AST: 35 U/L (ref 15–41)
Albumin: 4.3 g/dL (ref 3.5–5.0)
Alkaline Phosphatase: 70 U/L (ref 38–126)
Anion gap: 10 (ref 5–15)
BUN: 25 mg/dL — ABNORMAL HIGH (ref 6–20)
CO2: 25 mmol/L (ref 22–32)
Calcium: 9.2 mg/dL (ref 8.9–10.3)
Chloride: 101 mmol/L (ref 98–111)
Creatinine, Ser: 0.82 mg/dL (ref 0.44–1.00)
GFR calc Af Amer: >60 mL/min (ref >60)
GFR calc non Af Amer: >60 mL/min (ref >60)
Glucose, Bld: 101 mg/dL — ABNORMAL HIGH (ref 70–99)
Potassium: 3.9 mmol/L (ref 3.5–5.1)
Sodium: 136 mmol/L (ref 135–145)
Total Bilirubin: 0.8 mg/dL (ref 0.3–1.2)
Total Protein: 7.7 g/dL (ref 6.5–8.1)

## 2018-12-02 LAB — FERRITIN: Ferritin: 83 ng/mL (ref 11–307)

## 2018-12-03 LAB — CANCER ANTIGEN 27.29: CA 27.29: 17.8 U/mL (ref 0.0–38.6)

## 2018-12-04 ENCOUNTER — Inpatient Hospital Stay: Payer: BLUE CROSS/BLUE SHIELD

## 2018-12-04 ENCOUNTER — Inpatient Hospital Stay: Payer: BLUE CROSS/BLUE SHIELD | Admitting: Urgent Care

## 2018-12-04 VITALS — BP 116/79 | HR 69 | Temp 98.2°F | Resp 16 | Wt 165.3 lb

## 2018-12-04 DIAGNOSIS — D649 Anemia, unspecified: Secondary | ICD-10-CM

## 2018-12-04 DIAGNOSIS — Z9884 Bariatric surgery status: Secondary | ICD-10-CM | POA: Diagnosis not present

## 2018-12-04 DIAGNOSIS — Z9013 Acquired absence of bilateral breasts and nipples: Secondary | ICD-10-CM | POA: Diagnosis not present

## 2018-12-04 DIAGNOSIS — C50911 Malignant neoplasm of unspecified site of right female breast: Secondary | ICD-10-CM

## 2018-12-04 DIAGNOSIS — D509 Iron deficiency anemia, unspecified: Secondary | ICD-10-CM

## 2018-12-04 DIAGNOSIS — D508 Other iron deficiency anemias: Secondary | ICD-10-CM

## 2018-12-04 NOTE — Progress Notes (Signed)
Pt here for follow up. Denies any concerns at this time.  

## 2018-12-04 NOTE — Progress Notes (Signed)
[facility] day:  12/04/2018  Chief Complaint: Jessica Hammond is a 58 y.o. female s/p gastric bypass surgery, iron deficiency, and stage I right breast cancer who is seen for 6 month assessment.  HPI: The patient was last seen in the medical oncology clinic on 05/23/2018.  At that time, patient was feeling well.  She complained of drainage from her RIGHT breast.  Followed by plastic surgeon.  Exam revealed an 8 x 4 mm open area to the RIGHT breast.  WBC 5700 ([place] 3200).  Hemoglobin 12.1, hematocrit 35.5, and platelets 333,000.  Ferritin low at 23 ng/mL CA27.29 stable at 14.6 U/mL.  She received Venofer 200 mg IV on 05/23/2018 and 05/29/2018.  CBC on 08/22/2018 revealed a WBC of 5900 (ANC 3300).  Hemoglobin 11.9, hematocrit 36.4, MCV 91.7, and platelets 293,000.  Ferritin remained stable at 90 ng/mL. CBC on 12/02/2018 revealed a WBC of 6700 ([place] 4200).  Hemoglobin 12.4, hematocrit 36.7, MCV 93.4, and platelets 326,000.  Ferritin remained stable at 83 ng/mL. CA27.29 17.8 U/mL.   In the interim, patient has been doing well overall.  She notes that her energy has been "up and down".  She denies any exertional shortness of breath or episodes of chest pain. Patient denies bleeding; no hematochezia, melena, or gross hematuria.  She denies any bruising.   Patient denies that she has experienced any B symptoms. She denies any interval infections.  Patient does not verbalize any concerns with regards to her breasts today. Patient performs monthly self breast examinations as recommended.  Patient advises that she maintains an adequate appetite. She is eating well. Patient maintains a diet rich in iron. She indicates that she eats meat and green leafy vegetables on a consistent basis. Weight today is 165 lb 5.5 oz (75 kg), which compared to her last visit to the clinic, represents a 2 pound increase.  Patient denies pain in the clinic today.  Past Medical History:   Diagnosis Date  . Cancer ([place]) 09/22/2008   rt breast  . GERD (gastroesophageal reflux disease)   . Hyperlipidemia   . Hypertension   . Pterygium eye, right   . Seasonal allergies     Past Surgical History:  Procedure Laterality Date  . ABDOMINAL HYSTERECTOMY     still has cervix  . AUGMENTATION MAMMAPLASTY  02/03/2009   tranflap  . BREAST EXCISIONAL BIOPSY Right 09/22/2008   lumpectomy +  . CHOLECYSTECTOMY    . COLONOSCOPY  2013  . COLONOSCOPY WITH PROPOFOL N/A 08/03/2016   Procedure: COLONOSCOPY WITH PROPOFOL;  Surgeon: [name], MD;  Location: [facility] ENDOSCOPY;  Service: Endoscopy;  Laterality: N/A;  . ESOPHAGOGASTRODUODENOSCOPY (EGD) WITH PROPOFOL N/A 08/03/2016   Procedure: ESOPHAGOGASTRODUODENOSCOPY (EGD) WITH PROPOFOL;  Surgeon: [name], MD;  Location: [facility] ENDOSCOPY;  Service: Endoscopy;  Laterality: N/A;  . GIVENS CAPSULE STUDY N/A 08/14/2016   Procedure: GIVENS CAPSULE STUDY;  Surgeon: [name], MD;  Location: [facility] ENDOSCOPY;  Service: Gastroenterology;  Laterality: N/A;  . INGUINAL HERNIA REPAIR Right   . LAPAROSCOPIC CHOLECYSTECTOMY W/ CHOLANGIOGRAPHY  1999  . LAPAROSCOPIC GASTRIC BYPASS  08/17/2013  . MASTECTOMY Bilateral 02/03/2009   bilat mast with tramflap recon.  . sleep apnea surgery  2003    Family History  Problem Relation Age of Onset  . Breast cancer Mother 64  . Breast cancer Maternal Aunt 9       twin sister to mother    Social History:  reports that she  has never smoked. She has never used smokeless tobacco. She reports current alcohol use. She reports that she does not use drugs.  She works at [employer].  She lives in [place].  The patient is alone today.  Allergies: No Known Allergies  Current Medications: Current Outpatient Medications  Medication Sig Dispense Refill  . BIOTIN PO Take by mouth.     . Calcium Carbonate-Vitamin D (CALCIUM-VITAMIN D3 PO) Take by mouth. 600-400    . Cholecalciferol (VITAMIN D3) 25 MCG (1000 UT) CAPS  Vitamin D3 25 mcg (1,000 unit) capsule  Take 1 capsule every day by oral route.    . Cyanocobalamin (VITAMIN B-12) 2500 MCG SUBL Place under the tongue.     Marland Kitchen desloratadine (CLARINEX) 5 MG tablet Take 5 mg by mouth daily.     Marland Kitchen lisinopril-hydrochlorothiazide (PRINZIDE,ZESTORETIC) 10-12.5 MG tablet Take by mouth daily.     . Multiple Vitamin (MULTI-VITAMINS) TABS Take by mouth.    . sertraline (ZOLOFT) 100 MG tablet Take by mouth at bedtime.     Marland Kitchen VITAMIN E PO Take 400 Units by mouth.      No current facility-administered medications for this visit.     Review of Systems  Constitutional: Negative for diaphoresis, fever, malaise/fatigue and weight loss (up 2 pounds).       "I am doing well". Energy is "up and down".   HENT: Negative.   Eyes: Negative.   Respiratory: Negative for cough, hemoptysis, sputum production and shortness of breath.   Cardiovascular: Negative for chest pain, palpitations, orthopnea, leg swelling and PND.  Gastrointestinal: Negative for abdominal pain, blood in stool, constipation, diarrhea, melena, nausea and vomiting.  Genitourinary: Negative for dysuria, frequency, hematuria and urgency.  Musculoskeletal: Negative for back pain, falls, joint pain and myalgias.  Skin: Negative for itching and rash.  Neurological: Negative for dizziness, tremors, weakness and headaches.  Endo/Heme/Allergies: Does not bruise/bleed easily.  Psychiatric/Behavioral: Negative for depression, memory loss and suicidal ideas. The patient is not nervous/anxious and does not have insomnia.   All other systems reviewed and are negative.  Performance status (ECOG): 0 - Asymptomatic  Vital Signs BP 116/79 (BP Location: Left Arm, Patient Position: Sitting)   Pulse 69   Temp 98.2 F (36.8 C) (Oral)   Resp 16   Wt 165 lb 5.5 oz (75 kg)   SpO2 98%   BMI 33.40 kg/m   Physical Exam  Constitutional: She is oriented to person, place, and time and well-developed, well-nourished, and in no  distress.  HENT:  Head: Normocephalic and atraumatic.  Mouth/Throat: Oropharynx is clear and moist and mucous membranes are normal.  Eyes: Pupils are equal, round, and reactive to light. EOM are normal. No scleral icterus.  Neck: Normal range of motion. Neck supple. No tracheal deviation present. No thyromegaly present.  Cardiovascular: Normal rate, regular rhythm, normal heart sounds and intact distal pulses. Exam reveals no gallop and no friction rub.  No murmur heard. Pulmonary/Chest: Effort normal and breath sounds normal. No respiratory distress. She has no wheezes. She has no rales.  Abdominal: Soft. Bowel sounds are normal. She exhibits no distension. There is no abdominal tenderness.  Musculoskeletal: Normal range of motion.        General: No tenderness or edema.  Lymphadenopathy:    She has no cervical adenopathy.    She has no axillary adenopathy.       Right: No inguinal and no supraclavicular adenopathy present.       Left: No inguinal and  no supraclavicular adenopathy present.  Neurological: She is alert and oriented to person, place, and time.  Skin: Skin is warm and dry. No rash noted. No erythema.  Psychiatric: Mood, affect and judgment normal.  Nursing note and vitals reviewed.   Appointment on 12/02/2018  Component Date Value Ref Range Status  . CA 27.29 12/02/2018 17.8  0.0 - 38.6 U/mL Final   Comment: (NOTE) Siemens Centaur Immunochemiluminometric Methodology [facility]) Values obtained with different assay methods or kits cannot be used interchangeably. Results cannot be interpreted as absolute evidence of the presence or absence of malignant disease. Performed At: [facility] [place], [place] [id number] Rush Farmer MD RX:[id number]   . Ferritin 12/02/2018 83  11 - 307 ng/mL Final   Performed at [facility], [address] [place], [place] [address]  . Sodium 12/02/2018 136  135 - 145 mmol/L Final  . Potassium 12/02/2018  3.9  3.5 - 5.1 mmol/L Final  . Chloride 12/02/2018 101  98 - 111 mmol/L Final  . CO2 12/02/2018 25  22 - 32 mmol/L Final  . Glucose, Bld 12/02/2018 101* 70 - 99 mg/dL Final  . BUN 12/02/2018 25* 6 - 20 mg/dL Final  . Creatinine, Ser 12/02/2018 0.82  0.44 - 1.00 mg/dL Final  . Calcium 12/02/2018 9.2  8.9 - 10.3 mg/dL Final  . Total Protein 12/02/2018 7.7  6.5 - 8.1 g/dL Final  . Albumin 12/02/2018 4.3  3.5 - 5.0 g/dL Final  . AST 12/02/2018 35  15 - 41 U/L Final  . ALT 12/02/2018 38  0 - 44 U/L Final  . Alkaline Phosphatase 12/02/2018 70  38 - 126 U/L Final  . Total Bilirubin 12/02/2018 0.8  0.3 - 1.2 mg/dL Final  . GFR calc non Af Amer 12/02/2018 >60  >60 mL/min Final  . GFR calc Af Amer 12/02/2018 >60  >60 mL/min Final  . Anion gap 12/02/2018 10  5 - 15 Final   Performed at [facility], [address] [place], [place] [address]  . WBC 12/02/2018 6.7  4.0 - 10.5 K/uL Final  . RBC 12/02/2018 3.93  3.87 - 5.11 MIL/uL Final  . Hemoglobin 12/02/2018 12.4  12.0 - 15.0 g/dL Final  . HCT 12/02/2018 36.7  36.0 - 46.0 % Final  . MCV 12/02/2018 93.4  80.0 - 100.0 fL Final  . MCH 12/02/2018 31.6  26.0 - 34.0 pg Final  . MCHC 12/02/2018 33.8  30.0 - 36.0 g/dL Final  . RDW 12/02/2018 13.0  11.5 - 15.5 % Final  . Platelets 12/02/2018 326  150 - 400 K/uL Final  . nRBC 12/02/2018 0.0  0.0 - 0.2 % Final  . Neutrophils Relative % 12/02/2018 62  % Final  . Neutro Abs 12/02/2018 4.2  1.7 - 7.7 K/uL Final  . Lymphocytes Relative 12/02/2018 27  % Final  . Lymphs Abs 12/02/2018 1.8  0.7 - 4.0 K/uL Final  . Monocytes Relative 12/02/2018 8  % Final  . Monocytes Absolute 12/02/2018 0.6  0.1 - 1.0 K/uL Final  . Eosinophils Relative 12/02/2018 2  % Final  . Eosinophils Absolute 12/02/2018 0.1  0.0 - 0.5 K/uL Final  . Basophils Relative 12/02/2018 1  % Final  . Basophils Absolute 12/02/2018 0.0  0.0 - 0.1 K/uL Final  . Immature Granulocytes 12/02/2018 0  % Final  . Abs Immature Granulocytes  12/02/2018 0.02  0.00 - 0.07 K/uL Final   Performed at [facility], [phone]  [address] [place], [place] [address]    Assessment:  Jessica Hammond is a 58 y.o. female with stage I right breast cancer s/p lumpectomy and sentinel lymph node biopsy in 10/2008. 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 Pathologic stage was T1aN0. Tumor was ER positive, PR positive and HER-2 negative.  She underwent bilateral mastectomy and TRAM flap reconstruction on 02/03/2009 for positive margins.  She received 2 years of tamoxifen and then was switched to Aromasin in 04/2011. She discontinued Aromasin on 07/15/2014.  CA 27.29 has been followed: 17.4 in 09/22/2010, 18.7 on 12/24/2011, 14.3 on 06/25/2012, 15.5 on 01/14/2013, 17.4 on 07/15/2013, 5.2 on 07/15/2014, 17.2 on 07/20/2016, 18.7 on 05/16/2017, 16.6 on 11/22/2017, 17.8 on 12/02/2018.  She receives mammograms yearly per Dr. Edwin Dada suggestion. Bilateral mammogram and right sided ultrasound on 11/08/2017 revealed 2 adjacent oil cyst at 9:30 o'clock right breast 8 cm from nipple. There was fat necrosis beneath the open skin wound area at the right breast 9:30 o'clock 4 cm from nipple.  Exam revealed redness and skin breakdown in the lateral right breast at the 9 o'clock position.  She is s/p gastric bypass surgery on 08/17/2013 and subsequent iron deficiency.  Labs on 07/20/2016 revealed a ferritin 5 with a saturation of 7% and a TIBC of 505 (high).  B12 was 4249.  SPEP revealed no monoclonal protein.    She received Venofer 4 (11/21, 12/5, 12/12, and 09/18/2016) and x 2 (05/23/2018 - 05/29/2018). She receives Venofer if her ferritin is < 30.    Ferritin has been followed:  5 on 07/20/2016, 68 on 11/16/2016, 44 on 05/16/2017, 32 on 08/16/2017, 31 on 11/22/2017, 23 on 05/22/2018, 90 on 08/22/2018, and 83 on 12/02/2018.  She is on oral B12 supplementation.  B12  was 2917 on 05/16/2017. B12 and folate were normal on 11/22/2017.  EGD on 08/03/2016 was normal.  Colonscopy on 08/03/2016 was normal except for 3 mm sessile polyp in the cecum. Pathology revealed melanosis coli negative for dysplasia or malignancy.  Capsule endoscopy revealed no abnormality.  Symptomatically, patient is doing well overall.  She notes that her energy is "up-and-down".  She denies any bruising or bleeding.  No chest pain or episodes of shortness of breath.  No B symptoms or breast concerns.  Eating well; weight up 2 pounds.  Incorporating iron rich foods into diet.  Exam is grossly unremarkable.   Plan: 1. Review labs from 12/02/2018. 2. Breast cancer  Clinically doing well.  No breast concerns.  Tumor marker stable.  Has completed endocrine therapy.  Followed by plastic surgeon.   Annual mammograms are ordered by surgeon. 3. Iron deficiency anemia  Labs reviewed as stable.   No anemia or microcytosis.  Ferritin stable at 83 ng/mL.  Intermittent fatigue related to underlying anemia.  Encouraged rest breaks and full 8 hour sleep period at night.   No need for additional IV iron replacement today.  Continue routine lab monitoring 4. RTC in 3 months for labs (CBC with diff, ferritin). 5. RTC in 6 months for MD assessment including breast exam, labs (CBC with diff, CMP, ferritin, CA27.29- day before) and +/- Venofer.   Honor Loh, NP  12/04/2018, 6:28 PM

## 2019-03-04 ENCOUNTER — Other Ambulatory Visit: Payer: Self-pay

## 2019-03-05 ENCOUNTER — Inpatient Hospital Stay: Payer: BC Managed Care – PPO | Attending: Hematology and Oncology

## 2019-03-05 DIAGNOSIS — C50911 Malignant neoplasm of unspecified site of right female breast: Secondary | ICD-10-CM | POA: Diagnosis present

## 2019-03-05 DIAGNOSIS — D509 Iron deficiency anemia, unspecified: Secondary | ICD-10-CM | POA: Diagnosis not present

## 2019-03-05 LAB — CBC WITH DIFFERENTIAL/PLATELET
Abs Immature Granulocytes: 0.01 10*3/uL (ref 0.00–0.07)
Basophils Absolute: 0 10*3/uL (ref 0.0–0.1)
Basophils Relative: 1 %
Eosinophils Absolute: 0.2 10*3/uL (ref 0.0–0.5)
Eosinophils Relative: 3 %
HCT: 38.1 % (ref 36.0–46.0)
Hemoglobin: 13 g/dL (ref 12.0–15.0)
Immature Granulocytes: 0 %
Lymphocytes Relative: 34 %
Lymphs Abs: 2 10*3/uL (ref 0.7–4.0)
MCH: 31.8 pg (ref 26.0–34.0)
MCHC: 34.1 g/dL (ref 30.0–36.0)
MCV: 93.2 fL (ref 80.0–100.0)
Monocytes Absolute: 0.6 10*3/uL (ref 0.1–1.0)
Monocytes Relative: 11 %
Neutro Abs: 3.1 10*3/uL (ref 1.7–7.7)
Neutrophils Relative %: 51 %
Platelets: 291 10*3/uL (ref 150–400)
RBC: 4.09 MIL/uL (ref 3.87–5.11)
RDW: 12.4 % (ref 11.5–15.5)
WBC: 5.9 10*3/uL (ref 4.0–10.5)
nRBC: 0 % (ref 0.0–0.2)

## 2019-03-05 LAB — FERRITIN: Ferritin: 36 ng/mL (ref 11–307)

## 2019-06-09 NOTE — Progress Notes (Signed)
[facility]  [address], [address] [place], [place] [address] Phone: [phone]  Fax: [phone]   Clinic Day:  06/11/2019  Referring physician: Rusty Aus, MD  Chief Complaint: Jessica Hammond is a 58 y.o. female with s/p gastric bypass surgery, iron deficiency, and stage I right breast cancer who is seen for 6 month assessment.  HPI: The patient was last seen in the medical oncology clinic by Honor Loh, NP on 12/04/2018. At that time, patient was doing well overall. She noted that her energy was "up-and-down". She denied any bruising or bleeding. No chest pain or episodes of shortness of breath. No B symptoms or breast concerns. Eating well; weight was up 2 pounds. She was incorporating iron rich foods into diet. Exam was grossly unremarkable. Tumor marker was stable. Ferritin was 83. There was no need for additional IV iron replacement.  Labs on 03/05/2019: Hematocrit 38.1, hemoglobin 13.0, MCV 93.2, platelets 291,000, WBC 5,900 (ANC 3,100). Ferritin 36.   During the interim, the she has felt good.  However, she notes low energy.  She denies ice pica.  She denies bleeding of any kind. She reports the iron pills upset her stomach. She is eating iron rich foods. She examines her breast monthly and denies any breast concerns.    Past Medical History:  Diagnosis Date  . Cancer ([place]) 09/22/2008   rt breast  . GERD (gastroesophageal reflux disease)   . Hyperlipidemia   . Hypertension   . Pterygium eye, right   . Seasonal allergies     Past Surgical History:  Procedure Laterality Date  . ABDOMINAL HYSTERECTOMY     still has cervix  . AUGMENTATION MAMMAPLASTY  02/03/2009   tranflap  . BREAST EXCISIONAL BIOPSY Right 09/22/2008   lumpectomy +  . CHOLECYSTECTOMY    . COLONOSCOPY  2013  . COLONOSCOPY WITH PROPOFOL N/A 08/03/2016   Procedure: COLONOSCOPY WITH PROPOFOL;  Surgeon: [name], MD;  Location: [facility] ENDOSCOPY;  Service: Endoscopy;  Laterality:  N/A;  . ESOPHAGOGASTRODUODENOSCOPY (EGD) WITH PROPOFOL N/A 08/03/2016   Procedure: ESOPHAGOGASTRODUODENOSCOPY (EGD) WITH PROPOFOL;  Surgeon: [name], MD;  Location: [facility] ENDOSCOPY;  Service: Endoscopy;  Laterality: N/A;  . GIVENS CAPSULE STUDY N/A 08/14/2016   Procedure: GIVENS CAPSULE STUDY;  Surgeon: [name], MD;  Location: [facility] ENDOSCOPY;  Service: Gastroenterology;  Laterality: N/A;  . INGUINAL HERNIA REPAIR Right   . LAPAROSCOPIC CHOLECYSTECTOMY W/ CHOLANGIOGRAPHY  1999  . LAPAROSCOPIC GASTRIC BYPASS  08/17/2013  . MASTECTOMY Bilateral 02/03/2009   bilat mast with tramflap recon.  . sleep apnea surgery  2003    Family History  Problem Relation Age of Onset  . Breast cancer Mother 54  . Breast cancer Maternal Aunt 58       twin sister to mother    Social History:  reports that she has never smoked. She has never used smokeless tobacco. She reports current alcohol use. She reports that she does not use drugs. She works at [employer].  She lives in [place].  The patient is alone today.  Allergies: No Known Allergies  Current Medications: Current Outpatient Medications  Medication Sig Dispense Refill  . BIOTIN PO Take by mouth.     . Calcium Carbonate-Vitamin D (CALCIUM-VITAMIN D3 PO) Take by mouth. 600-400    . Cholecalciferol (VITAMIN D3) 25 MCG (1000 UT) CAPS Vitamin D3 25 mcg (1,000 unit) capsule  Take 1 capsule every day by oral route.    . Cyanocobalamin (VITAMIN B-12) 2500  MCG SUBL Place under the tongue.     Marland Kitchen desloratadine (CLARINEX) 5 MG tablet Take 5 mg by mouth daily.     Marland Kitchen lisinopril-hydrochlorothiazide (PRINZIDE,ZESTORETIC) 10-12.5 MG tablet Take by mouth daily.     . Multiple Vitamin (MULTI-VITAMINS) TABS Take by mouth.    . sertraline (ZOLOFT) 100 MG tablet Take by mouth at bedtime.     Marland Kitchen VITAMIN E PO Take 400 Units by mouth.      No current facility-administered medications for this visit.     Review of Systems  Constitutional: Negative for  diaphoresis, fever, malaise/fatigue and weight loss (stable).       Feels good, but energy level is low.   HENT: Negative.  Negative for congestion, hearing loss, nosebleeds, sore throat and tinnitus.   Eyes: Negative.  Negative for blurred vision and double vision.  Respiratory: Negative.  Negative for cough, hemoptysis, sputum production and shortness of breath.   Cardiovascular: Negative.  Negative for chest pain, palpitations, orthopnea, leg swelling and PND.  Gastrointestinal: Negative for abdominal pain, blood in stool, constipation, diarrhea, melena, nausea and vomiting.       Eating well.  Genitourinary: Negative.  Negative for dysuria, frequency, hematuria and urgency.  Musculoskeletal: Negative.  Negative for back pain, falls, joint pain and myalgias.  Skin: Negative.  Negative for itching and rash.  Neurological: Negative.  Negative for dizziness, tremors, sensory change, speech change, focal weakness, weakness and headaches.  Endo/Heme/Allergies: Negative.  Does not bruise/bleed easily.  Psychiatric/Behavioral: Negative for depression and memory loss. The patient is not nervous/anxious and does not have insomnia.   All other systems reviewed and are negative.  Performance status (ECOG): 0  Vitals Blood pressure 133/76, pulse 72, temperature (!) 96.5 F (35.8 C), resp. rate 18, height '4\' 10"'  (1.473 m), weight 166 lb 10.7 oz (75.6 kg), SpO2 99 %.  Physical Exam  Constitutional: She is oriented to person, place, and time. She appears well-developed and well-nourished. No distress.  HENT:  Head: Normocephalic and atraumatic.  Mouth/Throat: Oropharynx is clear and moist. No oropharyngeal exudate.  Short brown/gray hair.  Mask.  Eyes: Pupils are equal, round, and reactive to light. Conjunctivae are normal. No scleral icterus.  Glasses.  Blue eyes.  Neck: Normal range of motion. Neck supple. No JVD present.  Cardiovascular: Normal rate, regular rhythm and normal heart sounds.  No  murmur heard. Pulmonary/Chest: Effort normal and breath sounds normal. No respiratory distress. She has no wheezes. She has no rales. She exhibits no tenderness.  RIGHT breast scar tissue 9-10 o'clock. LEFT breast scar tissue superiorly and at 8 o'clock.  Abdominal: Soft. Bowel sounds are normal. She exhibits no mass. There is no abdominal tenderness. There is no rebound and no guarding.  Musculoskeletal: Normal range of motion.        General: No tenderness or edema.  Lymphadenopathy:    She has no cervical adenopathy.    She has no axillary adenopathy.       Right: No supraclavicular adenopathy present.       Left: No supraclavicular adenopathy present.  Neurological: She is alert and oriented to person, place, and time.  Skin: Skin is warm and dry. No rash noted. She is not diaphoretic. No erythema. No pallor.  Psychiatric: She has a normal mood and affect. Her behavior is normal. Judgment and thought content normal.  Nursing note and vitals reviewed.   Appointment on 06/10/2019  Component Date Value Ref Range Status  . CA 27.29 06/10/2019 10.2  0.0 - 38.6 U/mL Final   Comment: (NOTE) Siemens Centaur Immunochemiluminometric Methodology [facility]) Values obtained with different assay methods or kits cannot be used interchangeably. Results cannot be interpreted as absolute evidence of the presence or absence of malignant disease. Performed At: [facility] [place], [place] [id number] Rush Farmer MD SA:[id number]   . Sodium 06/10/2019 137  135 - 145 mmol/L Final  . Potassium 06/10/2019 3.9  3.5 - 5.1 mmol/L Final  . Chloride 06/10/2019 100  98 - 111 mmol/L Final  . CO2 06/10/2019 24  22 - 32 mmol/L Final  . Glucose, Bld 06/10/2019 175* 70 - 99 mg/dL Final  . BUN 06/10/2019 21* 6 - 20 mg/dL Final  . Creatinine, Ser 06/10/2019 1.16* 0.44 - 1.00 mg/dL Final  . Calcium 06/10/2019 9.5  8.9 - 10.3 mg/dL Final  . Total Protein 06/10/2019 7.5  6.5 - 8.1 g/dL Final   . Albumin 06/10/2019 4.2  3.5 - 5.0 g/dL Final  . AST 06/10/2019 20  15 - 41 U/L Final  . ALT 06/10/2019 22  0 - 44 U/L Final  . Alkaline Phosphatase 06/10/2019 50  38 - 126 U/L Final  . Total Bilirubin 06/10/2019 0.7  0.3 - 1.2 mg/dL Final  . GFR calc non Af Amer 06/10/2019 51* >60 mL/min Final  . GFR calc Af Amer 06/10/2019 60* >60 mL/min Final  . Anion gap 06/10/2019 13  5 - 15 Final   Performed at [facility], [address], [place], [place] [address]  . Ferritin 06/10/2019 35  11 - 307 ng/mL Final   Performed at [facility], [address] [place], [place] [address]  . WBC 06/10/2019 6.4  4.0 - 10.5 K/uL Final  . RBC 06/10/2019 3.96  3.87 - 5.11 MIL/uL Final  . Hemoglobin 06/10/2019 12.5  12.0 - 15.0 g/dL Final  . HCT 06/10/2019 36.7  36.0 - 46.0 % Final  . MCV 06/10/2019 92.7  80.0 - 100.0 fL Final  . MCH 06/10/2019 31.6  26.0 - 34.0 pg Final  . MCHC 06/10/2019 34.1  30.0 - 36.0 g/dL Final  . RDW 06/10/2019 12.8  11.5 - 15.5 % Final  . Platelets 06/10/2019 290  150 - 400 K/uL Final  . nRBC 06/10/2019 0.0  0.0 - 0.2 % Final  . Neutrophils Relative % 06/10/2019 58  % Final  . Neutro Abs 06/10/2019 3.7  1.7 - 7.7 K/uL Final  . Lymphocytes Relative 06/10/2019 30  % Final  . Lymphs Abs 06/10/2019 1.9  0.7 - 4.0 K/uL Final  . Monocytes Relative 06/10/2019 8  % Final  . Monocytes Absolute 06/10/2019 0.5  0.1 - 1.0 K/uL Final  . Eosinophils Relative 06/10/2019 3  % Final  . Eosinophils Absolute 06/10/2019 0.2  0.0 - 0.5 K/uL Final  . Basophils Relative 06/10/2019 1  % Final  . Basophils Absolute 06/10/2019 0.1  0.0 - 0.1 K/uL Final  . Immature Granulocytes 06/10/2019 0  % Final  . Abs Immature Granulocytes 06/10/2019 0.02  0.00 - 0.07 K/uL Final   Performed at [facility], [address], [place], [place] [address]    Assessment:  KAYSI OURADA is a 58 y.o. female with stage I right breast cancer s/p lumpectomy and sentinel lymph node biopsy  in 10/2008. 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 Pathologic stage was T1aN0. Tumor was ER positive, PR positive  and HER-2 negative.  She underwent bilateral mastectomy and TRAM flap reconstruction on 02/03/2009 for positive margins.  She received 2 years of tamoxifen and then was switched to Aromasin in 04/2011. She discontinued Aromasin on 07/15/2014.  CA 27.29 has been followed: 17.4 in 09/22/2010, 18.7 on 12/24/2011, 14.3 on 06/25/2012, 15.5 on 01/14/2013, 17.4 on 07/15/2013, 5.2 on 07/15/2014, 17.2 on 07/20/2016, 18.7 on 05/16/2017, 16.6 on 11/22/2017, 17.8 on 12/02/2018, and 10.2 on 06/10/2019.  She receives mammograms yearly per Dr. Edwin Dada suggestion. Bilateral mammogram and right sided ultrasound on 11/08/2017 revealed 2 adjacent oil cyst at 9:30 o'clock right breast 8 cm from nipple. There was fat necrosis beneath the open skin wound area at the right breast 9:30 o'clock 4 cm from nipple.  Exam revealed redness and skin breakdown in the lateral right breast at the 9 o'clock position.  She is s/p gastric bypass surgery on 08/17/2013 and subsequent iron deficiency.  Labs on 07/20/2016 revealed a ferritin 5 with a saturation of 7% and a TIBC of 505 (high).  B12 was 4249.  SPEP revealed no monoclonal protein.    She received Venofer 4 (11/21, 12/5, 12/12, and 09/18/2016) and x 2 (05/23/2018 - 05/29/2018). She receives Venofer if her ferritin is < 30.    Ferritin has been followed:  5 on 07/20/2016, 68 on 11/16/2016, 44 on 05/16/2017, 32 on 08/16/2017, 31 on 11/22/2017, 23 on 05/22/2018, 90 on 08/22/2018, 83 on 12/02/2018, 36 on 03/05/2019, and 35 on 06/10/2019.  She is on oral B12 supplementation.  B12 was 2917 on 05/16/2017. B12 and folate were normal on 11/22/2017.  EGD on 08/03/2016 was normal.  Colonscopy on 08/03/2016 was normal except for 3 mm sessile polyp in the cecum. Pathology  revealed melanosis coli negative for dysplasia or malignancy.  Capsule endoscopy revealed no abnormality.  Symptomatically, she notes a low energy level.  Exam is stable.    Plan: 1.   Review labs from 06/10/2019. 2.   Stage I right breast cancer Clinically, she is doing well. Exam reveals no evidence of recurrent disease. CA 27.29 is normal. She completed Aromasin in 07/2014. Mammogram on 11/08/2017 revealed no evidence of recurrence. Schedule next available mammogram. 3.   Iron deficiency anemia  Hematocrit 36.7.  Hemoglobin 12.5.   Ferritin 35.   Clinically, she notes some fatigue. Typically receives better for when ferritin < 35. Discuss consideration of Venofer x2.  Patient in agreement. Veniofer today and in 1 week 4.   RTC in 3 months for labs (CBC with diff, ferritin). 5.   RTC in 6 months for MD assessment, labs (CBC with diff, CMP, ferritin, CA 27.29 - day before), review of mammogram, and +/- Venofer.  I discussed the assessment and treatment plan with the patient.  The patient was provided an opportunity to ask questions and all were answered.  The patient agreed with the plan and demonstrated an understanding of the instructions.  The patient was advised to call back if the symptoms worsen or if the condition fails to improve as anticipated.   Lequita Asal, MD, PhD    06/11/2019, 8:56 AM  I, Selena Batten, am acting as scribe for [employer]. Mike Gip, MD, PhD.  I, Melissa C. Mike Gip, MD, have reviewed the above documentation for accuracy and completeness, and I agree with the above.

## 2019-06-10 ENCOUNTER — Inpatient Hospital Stay: Payer: BC Managed Care – PPO | Attending: Hematology and Oncology

## 2019-06-10 ENCOUNTER — Other Ambulatory Visit: Payer: Self-pay

## 2019-06-10 ENCOUNTER — Inpatient Hospital Stay: Payer: BC Managed Care – PPO

## 2019-06-10 DIAGNOSIS — Z9884 Bariatric surgery status: Secondary | ICD-10-CM | POA: Insufficient documentation

## 2019-06-10 DIAGNOSIS — C50911 Malignant neoplasm of unspecified site of right female breast: Secondary | ICD-10-CM | POA: Insufficient documentation

## 2019-06-10 DIAGNOSIS — D509 Iron deficiency anemia, unspecified: Secondary | ICD-10-CM | POA: Diagnosis present

## 2019-06-10 DIAGNOSIS — Z9013 Acquired absence of bilateral breasts and nipples: Secondary | ICD-10-CM | POA: Diagnosis not present

## 2019-06-10 LAB — COMPREHENSIVE METABOLIC PANEL
ALT: 22 U/L (ref 0–44)
AST: 20 U/L (ref 15–41)
Albumin: 4.2 g/dL (ref 3.5–5.0)
Alkaline Phosphatase: 50 U/L (ref 38–126)
Anion gap: 13 (ref 5–15)
BUN: 21 mg/dL — ABNORMAL HIGH (ref 6–20)
CO2: 24 mmol/L (ref 22–32)
Calcium: 9.5 mg/dL (ref 8.9–10.3)
Chloride: 100 mmol/L (ref 98–111)
Creatinine, Ser: 1.16 mg/dL — ABNORMAL HIGH (ref 0.44–1.00)
GFR calc Af Amer: 60 mL/min — ABNORMAL LOW (ref >60)
GFR calc non Af Amer: 51 mL/min — ABNORMAL LOW (ref >60)
Glucose, Bld: 175 mg/dL — ABNORMAL HIGH (ref 70–99)
Potassium: 3.9 mmol/L (ref 3.5–5.1)
Sodium: 137 mmol/L (ref 135–145)
Total Bilirubin: 0.7 mg/dL (ref 0.3–1.2)
Total Protein: 7.5 g/dL (ref 6.5–8.1)

## 2019-06-10 LAB — CBC WITH DIFFERENTIAL/PLATELET
Abs Immature Granulocytes: 0.02 10*3/uL (ref 0.00–0.07)
Basophils Absolute: 0.1 10*3/uL (ref 0.0–0.1)
Basophils Relative: 1 %
Eosinophils Absolute: 0.2 10*3/uL (ref 0.0–0.5)
Eosinophils Relative: 3 %
HCT: 36.7 % (ref 36.0–46.0)
Hemoglobin: 12.5 g/dL (ref 12.0–15.0)
Immature Granulocytes: 0 %
Lymphocytes Relative: 30 %
Lymphs Abs: 1.9 10*3/uL (ref 0.7–4.0)
MCH: 31.6 pg (ref 26.0–34.0)
MCHC: 34.1 g/dL (ref 30.0–36.0)
MCV: 92.7 fL (ref 80.0–100.0)
Monocytes Absolute: 0.5 10*3/uL (ref 0.1–1.0)
Monocytes Relative: 8 %
Neutro Abs: 3.7 10*3/uL (ref 1.7–7.7)
Neutrophils Relative %: 58 %
Platelets: 290 10*3/uL (ref 150–400)
RBC: 3.96 MIL/uL (ref 3.87–5.11)
RDW: 12.8 % (ref 11.5–15.5)
WBC: 6.4 10*3/uL (ref 4.0–10.5)
nRBC: 0 % (ref 0.0–0.2)

## 2019-06-10 LAB — FERRITIN: Ferritin: 35 ng/mL (ref 11–307)

## 2019-06-11 ENCOUNTER — Inpatient Hospital Stay (HOSPITAL_BASED_OUTPATIENT_CLINIC_OR_DEPARTMENT_OTHER): Payer: BC Managed Care – PPO | Admitting: Hematology and Oncology

## 2019-06-11 ENCOUNTER — Encounter: Payer: Self-pay | Admitting: Hematology and Oncology

## 2019-06-11 ENCOUNTER — Inpatient Hospital Stay: Payer: BC Managed Care – PPO

## 2019-06-11 VITALS — BP 118/78 | HR 72 | Resp 18

## 2019-06-11 VITALS — BP 133/76 | HR 72 | Temp 96.5°F | Resp 18 | Ht <= 58 in | Wt 166.7 lb

## 2019-06-11 DIAGNOSIS — C50911 Malignant neoplasm of unspecified site of right female breast: Secondary | ICD-10-CM

## 2019-06-11 DIAGNOSIS — Z9884 Bariatric surgery status: Secondary | ICD-10-CM

## 2019-06-11 DIAGNOSIS — D509 Iron deficiency anemia, unspecified: Secondary | ICD-10-CM | POA: Diagnosis not present

## 2019-06-11 DIAGNOSIS — D508 Other iron deficiency anemias: Secondary | ICD-10-CM

## 2019-06-11 DIAGNOSIS — K9589 Other complications of other bariatric procedure: Secondary | ICD-10-CM

## 2019-06-11 LAB — CANCER ANTIGEN 27.29: CA 27.29: 10.2 U/mL (ref 0.0–38.6)

## 2019-06-11 MED ORDER — SODIUM CHLORIDE 0.9 % IV SOLN
Freq: Once | INTRAVENOUS | Status: AC
  Administered 2019-06-11: 09:00:00 via INTRAVENOUS
  Filled 2019-06-11: qty 250

## 2019-06-11 MED ORDER — IRON SUCROSE 20 MG/ML IV SOLN
200.0000 mg | Freq: Once | INTRAVENOUS | Status: AC
  Administered 2019-06-11: 09:00:00 200 mg via INTRAVENOUS

## 2019-06-11 NOTE — Progress Notes (Signed)
No new changes noted today 

## 2019-06-11 NOTE — Patient Instructions (Signed)

## 2019-06-19 ENCOUNTER — Other Ambulatory Visit: Payer: Self-pay

## 2019-06-19 ENCOUNTER — Inpatient Hospital Stay: Payer: BC Managed Care – PPO | Attending: Hematology and Oncology

## 2019-06-19 VITALS — BP 119/81 | HR 72 | Temp 98.6°F | Resp 18

## 2019-06-19 DIAGNOSIS — D508 Other iron deficiency anemias: Secondary | ICD-10-CM

## 2019-06-19 DIAGNOSIS — D509 Iron deficiency anemia, unspecified: Secondary | ICD-10-CM | POA: Diagnosis not present

## 2019-06-19 DIAGNOSIS — Z9884 Bariatric surgery status: Secondary | ICD-10-CM

## 2019-06-19 MED ORDER — IRON SUCROSE 20 MG/ML IV SOLN
200.0000 mg | Freq: Once | INTRAVENOUS | Status: AC
  Administered 2019-06-19: 200 mg via INTRAVENOUS

## 2019-06-19 MED ORDER — SODIUM CHLORIDE 0.9 % IV SOLN
Freq: Once | INTRAVENOUS | Status: AC
  Administered 2019-06-19: 09:00:00 via INTRAVENOUS
  Filled 2019-06-19: qty 250

## 2019-06-19 NOTE — Patient Instructions (Signed)

## 2019-09-09 ENCOUNTER — Inpatient Hospital Stay: Payer: BC Managed Care – PPO | Attending: Hematology and Oncology

## 2019-09-09 ENCOUNTER — Other Ambulatory Visit: Payer: Self-pay

## 2019-09-09 DIAGNOSIS — K909 Intestinal malabsorption, unspecified: Secondary | ICD-10-CM | POA: Insufficient documentation

## 2019-09-09 DIAGNOSIS — Z79899 Other long term (current) drug therapy: Secondary | ICD-10-CM | POA: Diagnosis not present

## 2019-09-09 DIAGNOSIS — Z9884 Bariatric surgery status: Secondary | ICD-10-CM | POA: Insufficient documentation

## 2019-09-09 DIAGNOSIS — Z853 Personal history of malignant neoplasm of breast: Secondary | ICD-10-CM | POA: Insufficient documentation

## 2019-09-09 DIAGNOSIS — Z9013 Acquired absence of bilateral breasts and nipples: Secondary | ICD-10-CM | POA: Insufficient documentation

## 2019-09-09 DIAGNOSIS — C50911 Malignant neoplasm of unspecified site of right female breast: Secondary | ICD-10-CM

## 2019-09-09 DIAGNOSIS — D508 Other iron deficiency anemias: Secondary | ICD-10-CM | POA: Insufficient documentation

## 2019-09-09 DIAGNOSIS — Z170 Estrogen receptor positive status [ER+]: Secondary | ICD-10-CM | POA: Insufficient documentation

## 2019-09-09 LAB — CBC WITH DIFFERENTIAL/PLATELET
Abs Immature Granulocytes: 0.02 10*3/uL (ref 0.00–0.07)
Basophils Absolute: 0.1 10*3/uL (ref 0.0–0.1)
Basophils Relative: 1 %
Eosinophils Absolute: 0.2 10*3/uL (ref 0.0–0.5)
Eosinophils Relative: 3 %
HCT: 35.6 % — ABNORMAL LOW (ref 36.0–46.0)
Hemoglobin: 12 g/dL (ref 12.0–15.0)
Immature Granulocytes: 0 %
Lymphocytes Relative: 34 %
Lymphs Abs: 2.2 10*3/uL (ref 0.7–4.0)
MCH: 31.2 pg (ref 26.0–34.0)
MCHC: 33.7 g/dL (ref 30.0–36.0)
MCV: 92.5 fL (ref 80.0–100.0)
Monocytes Absolute: 0.6 10*3/uL (ref 0.1–1.0)
Monocytes Relative: 9 %
Neutro Abs: 3.3 10*3/uL (ref 1.7–7.7)
Neutrophils Relative %: 53 %
Platelets: 283 10*3/uL (ref 150–400)
RBC: 3.85 MIL/uL — ABNORMAL LOW (ref 3.87–5.11)
RDW: 12.7 % (ref 11.5–15.5)
WBC: 6.3 10*3/uL (ref 4.0–10.5)
nRBC: 0 % (ref 0.0–0.2)

## 2019-09-09 LAB — FERRITIN: Ferritin: 111 ng/mL (ref 11–307)

## 2019-12-08 ENCOUNTER — Other Ambulatory Visit: Payer: BC Managed Care – PPO

## 2019-12-09 ENCOUNTER — Ambulatory Visit: Payer: BC Managed Care – PPO

## 2019-12-09 ENCOUNTER — Ambulatory Visit: Payer: BC Managed Care – PPO | Admitting: Oncology

## 2019-12-24 ENCOUNTER — Other Ambulatory Visit: Payer: Self-pay

## 2019-12-24 DIAGNOSIS — D508 Other iron deficiency anemias: Secondary | ICD-10-CM

## 2019-12-24 DIAGNOSIS — C50911 Malignant neoplasm of unspecified site of right female breast: Secondary | ICD-10-CM

## 2019-12-28 NOTE — Progress Notes (Signed)
[facility]  [address] [place], [place] [address] Phone: [phone]  Fax: [phone]   Clinic Day:  12/30/2019  Referring physician: Rusty Aus, MD  Chief Complaint: Jessica Hammond is a 59 y.o. female with s/p gastric bypass surgery, iron deficiency, and stage I right breast cancer who is seen for 6 month assessment.  HPI: The patient was last seen in the medical oncology clinic on 06/11/2019. At that time, she noted a low energy level.  Exam was stable. CBC revealed a hematocrit of 36.7, hemoglobin 12.5, and MCV 92.7.  Ferritin was 35.  CA27.29 was 10.2.  Mammogram was re-scheduled for next available.  She received Venofer weekly x 2 (06/11/2019 and 06/19/2019).  Labs followed: 09/09/2019:  Hematocrit 35.6, hemoglobin 12.0, MCV 92.5, platelets 283,000, WBC 6300, ANC 3300. Ferritin 111.  12/29/2019:  Hematocrit 37.3, hemoglobin 12.6, MCV 92.6, platelets 310,000, WBC 5900, ANC 3100. Ferritin 68.  Symptomatically, she feels pretty good. She has been eating iron rich foods. She takes a B12 supplement pill.  She says she does monthly breast exams.   Mammogram is scheduled for 12/31/2019.    Past Medical History:  Diagnosis Date  . Cancer ([place]) 09/22/2008   rt breast  . GERD (gastroesophageal reflux disease)   . Hyperlipidemia   . Hypertension   . Pterygium eye, right   . Seasonal allergies     Past Surgical History:  Procedure Laterality Date  . ABDOMINAL HYSTERECTOMY     still has cervix  . AUGMENTATION MAMMAPLASTY  02/03/2009   tranflap  . BREAST EXCISIONAL BIOPSY Right 09/22/2008   lumpectomy +  . CHOLECYSTECTOMY    . COLONOSCOPY  2013  . COLONOSCOPY WITH PROPOFOL N/A 08/03/2016   Procedure: COLONOSCOPY WITH PROPOFOL;  Surgeon: [name], MD;  Location: [facility] ENDOSCOPY;  Service: Endoscopy;  Laterality: N/A;  . ESOPHAGOGASTRODUODENOSCOPY (EGD) WITH PROPOFOL N/A 08/03/2016   Procedure: ESOPHAGOGASTRODUODENOSCOPY (EGD) WITH  PROPOFOL;  Surgeon: [name], MD;  Location: [facility] ENDOSCOPY;  Service: Endoscopy;  Laterality: N/A;  . GIVENS CAPSULE STUDY N/A 08/14/2016   Procedure: GIVENS CAPSULE STUDY;  Surgeon: [name], MD;  Location: [facility] ENDOSCOPY;  Service: Gastroenterology;  Laterality: N/A;  . INGUINAL HERNIA REPAIR Right   . LAPAROSCOPIC CHOLECYSTECTOMY W/ CHOLANGIOGRAPHY  1999  . LAPAROSCOPIC GASTRIC BYPASS  08/17/2013  . MASTECTOMY Bilateral 02/03/2009   bilat mast with tramflap recon.  . sleep apnea surgery  2003    Family History  Problem Relation Age of Onset  . Breast cancer Mother 45  . Breast cancer Maternal Aunt 91       twin sister to mother    Social History:  reports that she has never smoked. She has never used smokeless tobacco. She reports current alcohol use. She reports that she does not use drugs. She works at [employer].  She lives in [place].  The patient is alone  today.  Allergies: No Known Allergies  Current Medications: Current Outpatient Medications  Medication Sig Dispense Refill  . BIOTIN PO Take by mouth.    . Calcium Carbonate-Vitamin D (CALCIUM-VITAMIN D3 PO) Take by mouth. 600-400    . Cholecalciferol (VITAMIN D3) 25 MCG (1000 UT) CAPS Vitamin D3 25 mcg (1,000 unit) capsule  Take 1 capsule every day by oral route.    . Cyanocobalamin (VITAMIN B-12) 2500 MCG SUBL Place under the tongue.     Marland Kitchen desloratadine (CLARINEX) 5 MG tablet Take 5 mg by mouth daily.     Marland Kitchen  escitalopram (LEXAPRO) 10 MG tablet Take 10 mg by mouth daily.    . Multiple Vitamin (MULTI-VITAMINS) TABS Take by mouth.    . Omega-3 Fatty Acids (FISH OIL) 1000 MG CAPS Take by mouth.    . rosuvastatin (CRESTOR) 10 MG tablet Take 10 mg by mouth daily.    Marland Kitchen VITAMIN E PO Take 400 Units by mouth.     Marland Kitchen lisinopril-hydrochlorothiazide (PRINZIDE,ZESTORETIC) 10-12.5 MG tablet Take by mouth daily.      No current facility-administered medications for this visit.    Review of Systems  Constitutional: Negative  for diaphoresis, fever, malaise/fatigue and weight loss (stable).       Feels "pretty good".  HENT: Negative.  Negative for congestion, hearing loss, nosebleeds, sore throat and tinnitus.   Eyes: Negative.  Negative for blurred vision and double vision.  Respiratory: Negative.  Negative for cough, hemoptysis, sputum production and shortness of breath.   Cardiovascular: Negative.  Negative for chest pain, palpitations, orthopnea, leg swelling and PND.  Gastrointestinal: Negative for abdominal pain, blood in stool, constipation, diarrhea, melena, nausea and vomiting.       Eating well.  Genitourinary: Negative.  Negative for dysuria, frequency, hematuria and urgency.  Musculoskeletal: Negative.  Negative for back pain, falls, joint pain and myalgias.  Skin: Negative.  Negative for itching and rash.  Neurological: Negative.  Negative for dizziness, tremors, sensory change, speech change, focal weakness, weakness and headaches.  Endo/Heme/Allergies: Negative.  Does not bruise/bleed easily.  Psychiatric/Behavioral: Negative for depression and memory loss. The patient is not nervous/anxious and does not have insomnia.   All other systems reviewed and are negative.  Performance status (ECOG):  0  Vitals Blood pressure 126/77, pulse 71, temperature 97.8 F (36.6 C), temperature source Tympanic, resp. rate 18, weight 171 lb 15.3 oz (78 kg), SpO2 98 %.  Physical Exam Vitals and nursing note reviewed.  Constitutional:      General: She is not in acute distress.    Appearance: She is well-developed and well-nourished. She is not diaphoretic.  HENT:     Head: Normocephalic and atraumatic.     Mouth/Throat:     Mouth: Oropharynx is clear and moist.     Pharynx: No oropharyngeal exudate.      Comments: Short brown/gray hair.  Mask. Eyes:     General: No scleral icterus.    Conjunctiva/sclera: Conjunctivae normal.     Pupils: Pupils are equal, round, and reactive to light.     Comments:  Glasses.  Blue eyes.  Neck:     Vascular: No JVD.  Cardiovascular:     Rate and Rhythm: Normal rate and regular rhythm.     Heart sounds: Normal heart sounds. No murmur heard.   Pulmonary:     Effort: Pulmonary effort is normal. No respiratory distress.     Breath sounds: Normal breath sounds. No wheezing or rales.     Comments: RIGHT breast scar tissue 9-10 o'clock. LEFT breast scar tissue superiorly and at 8 o'clock. Chest:     Chest wall: No tenderness.  Abdominal:     General: Bowel sounds are normal.     Palpations: Abdomen is soft. There is no mass.     Tenderness: There is no abdominal tenderness. There is no guarding or rebound.  Musculoskeletal:        General: No tenderness or edema. Normal range of motion.     Cervical back: Normal range of motion and neck supple.  Lymphadenopathy:     Head:  Right side of head: No preauricular, posterior auricular or occipital adenopathy.     Left side of head: No preauricular, posterior auricular or occipital adenopathy.     Cervical: No cervical adenopathy.     Upper Body:  No axillary adenopathy present.    Right upper body: No supraclavicular adenopathy.     Left upper body: No supraclavicular adenopathy.     Lower Body: No right inguinal adenopathy. No left inguinal adenopathy.  Skin:    General: Skin is warm and dry.     Coloration: Skin is not pale.     Findings: No erythema or rash.  Neurological:     Mental Status: She is alert and oriented to person, place, and time.  Psychiatric:        Mood and Affect: Mood and affect normal.        Behavior: Behavior normal.        Thought Content: Thought content normal.        Judgment: Judgment normal.    Appointment on 12/29/2019  Component Date Value Ref Range Status  . Ferritin 12/29/2019 68  11 - 307 ng/mL Final   Performed at [facility], [address] [place], [place] [address]  . WBC 12/29/2019 5.9  4.0 - 10.5 K/uL Final  . RBC 12/29/2019 4.03  3.87 -  5.11 MIL/uL Final  . Hemoglobin 12/29/2019 12.6  12.0 - 15.0 g/dL Final  . HCT 12/29/2019 37.3  36.0 - 46.0 % Final  . MCV 12/29/2019 92.6  80.0 - 100.0 fL Final  . MCH 12/29/2019 31.3  26.0 - 34.0 pg Final  . MCHC 12/29/2019 33.8  30.0 - 36.0 g/dL Final  . RDW 12/29/2019 11.9  11.5 - 15.5 % Final  . Platelets 12/29/2019 310  150 - 400 K/uL Final  . nRBC 12/29/2019 0.0  0.0 - 0.2 % Final  . Neutrophils Relative % 12/29/2019 52  % Final  . Neutro Abs 12/29/2019 3.1  1.7 - 7.7 K/uL Final  . Lymphocytes Relative 12/29/2019 35  % Final  . Lymphs Abs 12/29/2019 2.1  0.7 - 4.0 K/uL Final  . Monocytes Relative 12/29/2019 10  % Final  . Monocytes Absolute 12/29/2019 0.6  0.1 - 1.0 K/uL Final  . Eosinophils Relative 12/29/2019 2  % Final  . Eosinophils Absolute 12/29/2019 0.1  0.0 - 0.5 K/uL Final  . Basophils Relative 12/29/2019 1  % Final  . Basophils Absolute 12/29/2019 0.1  0.0 - 0.1 K/uL Final  . Immature Granulocytes 12/29/2019 0  % Final  . Abs Immature Granulocytes 12/29/2019 0.01  0.00 - 0.07 K/uL Final   Performed at [facility], [address], [place], [place] [address]  . Sodium 12/29/2019 137  135 - 145 mmol/L Final  . Potassium 12/29/2019 4.2  3.5 - 5.1 mmol/L Final  . Chloride 12/29/2019 102  98 - 111 mmol/L Final  . CO2 12/29/2019 26  22 - 32 mmol/L Final  . Glucose, Bld 12/29/2019 89  70 - 99 mg/dL Final   Glucose reference range applies only to samples taken after fasting for at least 8 hours.  . BUN 12/29/2019 14  6 - 20 mg/dL Final  . Creatinine, Ser 12/29/2019 0.95  0.44 - 1.00 mg/dL Final  . Calcium 12/29/2019 9.3  8.9 - 10.3 mg/dL Final  . Total Protein 12/29/2019 7.1  6.5 - 8.1 g/dL Final  . Albumin 12/29/2019 4.0  3.5 - 5.0 g/dL Final  . AST 12/29/2019 22  15 - 41 U/L Final  . ALT 12/29/2019 27  0 - 44 U/L Final  . Alkaline Phosphatase 12/29/2019 50  38 - 126 U/L Final  . Total Bilirubin 12/29/2019 0.4  0.3 - 1.2 mg/dL Final  . GFR calc non Af Amer  12/29/2019 >60  >60 mL/min Final  . GFR calc Af Amer 12/29/2019 >60  >60 mL/min Final  . Anion gap 12/29/2019 9  5 - 15 Final   Performed at [facility], [address], [place], [place] [address]    Assessment:  Jessica Hammond is a 59 y.o. female with stage I right breast cancer s/p lumpectomy and sentinel lymph node biopsy in 10/2008. 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 Pathologic stage was T1aN0. Tumor was ER positive, PR positive and HER-2 negative.  She underwent bilateral mastectomy and TRAM flap reconstruction on 02/03/2009 for positive margins.  She received 2 years of tamoxifen and then was switched to Aromasin in 04/2011. She discontinued Aromasin on 07/15/2014.  CA 27.29 has been followed: 17.4 in 09/22/2010, 18.7 on 12/24/2011, 14.3 on 06/25/2012, 15.5 on 01/14/2013, 17.4 on 07/15/2013, 5.2 on 07/15/2014, 17.2 on 07/20/2016, 18.7 on 05/16/2017, 16.6 on 11/22/2017, 17.8 on 12/02/2018, and 10.2 on 06/10/2019.  She receives mammograms yearly per Dr. Edwin Dada suggestion. Bilateral mammogram and right sided ultrasound on 11/08/2017 revealed 2 adjacent oil cyst at 9:30 o'clock right breast 8 cm from nipple. There was fat necrosis beneath the open skin wound area at the right breast 9:30 o'clock 4 cm from nipple.  Exam revealed redness and skin breakdown in the lateral right breast at the 9 o'clock position.  She is s/p gastric bypass surgery on 08/17/2013 and subsequent iron deficiency.  Labs on 07/20/2016 revealed a ferritin 5 with a saturation of 7% and a TIBC of 505 (high).  B12 was 4249.  SPEP revealed no monoclonal protein.    She received Venofer 4 (11/21, 12/5, 12/12, and 09/18/2016), x 2 (05/23/2018 - 05/29/2018), and x2 (06/11/2019 and 06/19/2019). She receives Venofer if her ferritin is < 30.    Ferritin has been followed:  5 on 07/20/2016, 68 on 11/16/2016, 44 on  05/16/2017, 32 on 08/16/2017, 31 on 11/22/2017, 23 on 05/22/2018, 90 on 08/22/2018, 83 on 12/02/2018, 36 on 03/05/2019, 35 on 06/10/2019, 111 on 09/09/2019, and 68 on 12/29/2019.  She is on oral B12 supplementation.  B12 was 2917 on 05/16/2017. B12 and folate were normal on 11/22/2017.  EGD on 08/03/2016 was normal.  Colonscopy on 08/03/2016 was normal except for 3 mm sessile polyp in the cecum. Pathology revealed melanosis coli negative for dysplasia or malignancy.  Capsule endoscopy revealed no abnormality.  Symptomatically, she feels "pretty good".  Exam is stable.  Hemoglobin is 12.6.  Plan: 1.   Review labs from 12/29/2019. 2.   Stage I right breast cancer Clinically, she is doing well. Exam no evidence of recurrent disease. CA 27.29 was 10.2 on 06/10/2019. She completed Aromasin in 07/2014. Mammogram on 11/08/2017 revealed no evidence of recurrent disease. Follow-up screening mammogram has been rescheduled for 12/31/2019. 3.   Iron deficiency anemia  Hematocrit 37.3.  Hemoglobin 12.6. MCV 92.6.   Ferritin 68.   Symptomatically, she is doing well. He receives a Artist for when ferritin < 35. No Venofer today. 4.   RTC in 3 months for labs (CBC with diff, ferritin, B12, folate). 5.   RTC in 6 months for  MD assessment including breast exam, labs (CBC with diff, CMP, ferritin, CA 27.29 - day before), review of mammogram, and +/- Venofer.  I discussed the assessment and treatment plan with the patient.  The patient was provided an opportunity to ask questions and all were answered.  The patient agreed with the plan and demonstrated an understanding of the instructions.  The patient was advised to call back if the symptoms worsen or if the condition fails to improve as anticipated.   Lequita Asal, MD, PhD    12/30/2019, 1:35 PM  I, Heywood Footman, am acting as a Education administrator for Lequita Asal, MD.  I, [place] Mike Gip, MD, have reviewed the above documentation for accuracy  and completeness, and I agree with the above.

## 2019-12-29 ENCOUNTER — Inpatient Hospital Stay: Payer: BC Managed Care – PPO | Attending: Hematology and Oncology

## 2019-12-29 ENCOUNTER — Other Ambulatory Visit: Payer: Self-pay

## 2019-12-29 DIAGNOSIS — Z79899 Other long term (current) drug therapy: Secondary | ICD-10-CM | POA: Insufficient documentation

## 2019-12-29 DIAGNOSIS — Z9079 Acquired absence of other genital organ(s): Secondary | ICD-10-CM | POA: Insufficient documentation

## 2019-12-29 DIAGNOSIS — K9589 Other complications of other bariatric procedure: Secondary | ICD-10-CM

## 2019-12-29 DIAGNOSIS — Z9884 Bariatric surgery status: Secondary | ICD-10-CM | POA: Diagnosis not present

## 2019-12-29 DIAGNOSIS — Z9013 Acquired absence of bilateral breasts and nipples: Secondary | ICD-10-CM | POA: Insufficient documentation

## 2019-12-29 DIAGNOSIS — D508 Other iron deficiency anemias: Secondary | ICD-10-CM | POA: Diagnosis not present

## 2019-12-29 DIAGNOSIS — D509 Iron deficiency anemia, unspecified: Secondary | ICD-10-CM

## 2019-12-29 DIAGNOSIS — C50911 Malignant neoplasm of unspecified site of right female breast: Secondary | ICD-10-CM

## 2019-12-29 DIAGNOSIS — Z853 Personal history of malignant neoplasm of breast: Secondary | ICD-10-CM | POA: Insufficient documentation

## 2019-12-29 DIAGNOSIS — I10 Essential (primary) hypertension: Secondary | ICD-10-CM | POA: Diagnosis not present

## 2019-12-29 LAB — CBC WITH DIFFERENTIAL/PLATELET
Abs Immature Granulocytes: 0.01 10*3/uL (ref 0.00–0.07)
Basophils Absolute: 0.1 10*3/uL (ref 0.0–0.1)
Basophils Relative: 1 %
Eosinophils Absolute: 0.1 10*3/uL (ref 0.0–0.5)
Eosinophils Relative: 2 %
HCT: 37.3 % (ref 36.0–46.0)
Hemoglobin: 12.6 g/dL (ref 12.0–15.0)
Immature Granulocytes: 0 %
Lymphocytes Relative: 35 %
Lymphs Abs: 2.1 10*3/uL (ref 0.7–4.0)
MCH: 31.3 pg (ref 26.0–34.0)
MCHC: 33.8 g/dL (ref 30.0–36.0)
MCV: 92.6 fL (ref 80.0–100.0)
Monocytes Absolute: 0.6 10*3/uL (ref 0.1–1.0)
Monocytes Relative: 10 %
Neutro Abs: 3.1 10*3/uL (ref 1.7–7.7)
Neutrophils Relative %: 52 %
Platelets: 310 10*3/uL (ref 150–400)
RBC: 4.03 MIL/uL (ref 3.87–5.11)
RDW: 11.9 % (ref 11.5–15.5)
WBC: 5.9 10*3/uL (ref 4.0–10.5)
nRBC: 0 % (ref 0.0–0.2)

## 2019-12-29 LAB — COMPREHENSIVE METABOLIC PANEL
ALT: 27 U/L (ref 0–44)
AST: 22 U/L (ref 15–41)
Albumin: 4 g/dL (ref 3.5–5.0)
Alkaline Phosphatase: 50 U/L (ref 38–126)
Anion gap: 9 (ref 5–15)
BUN: 14 mg/dL (ref 6–20)
CO2: 26 mmol/L (ref 22–32)
Calcium: 9.3 mg/dL (ref 8.9–10.3)
Chloride: 102 mmol/L (ref 98–111)
Creatinine, Ser: 0.95 mg/dL (ref 0.44–1.00)
GFR calc Af Amer: >60 mL/min (ref >60)
GFR calc non Af Amer: >60 mL/min (ref >60)
Glucose, Bld: 89 mg/dL (ref 70–99)
Potassium: 4.2 mmol/L (ref 3.5–5.1)
Sodium: 137 mmol/L (ref 135–145)
Total Bilirubin: 0.4 mg/dL (ref 0.3–1.2)
Total Protein: 7.1 g/dL (ref 6.5–8.1)

## 2019-12-29 LAB — FERRITIN: Ferritin: 68 ng/mL (ref 11–307)

## 2019-12-29 NOTE — Progress Notes (Signed)
Confirmed patient name and DOB. Patient denies new concerns

## 2019-12-30 ENCOUNTER — Inpatient Hospital Stay: Payer: BC Managed Care – PPO

## 2019-12-30 ENCOUNTER — Inpatient Hospital Stay (HOSPITAL_BASED_OUTPATIENT_CLINIC_OR_DEPARTMENT_OTHER): Payer: BC Managed Care – PPO | Admitting: Hematology and Oncology

## 2019-12-30 VITALS — BP 126/77 | HR 71 | Temp 97.8°F | Resp 18 | Wt 172.0 lb

## 2019-12-30 DIAGNOSIS — D509 Iron deficiency anemia, unspecified: Secondary | ICD-10-CM | POA: Diagnosis not present

## 2019-12-30 DIAGNOSIS — D508 Other iron deficiency anemias: Secondary | ICD-10-CM

## 2019-12-30 DIAGNOSIS — Z9013 Acquired absence of bilateral breasts and nipples: Secondary | ICD-10-CM | POA: Diagnosis not present

## 2019-12-30 DIAGNOSIS — C50911 Malignant neoplasm of unspecified site of right female breast: Secondary | ICD-10-CM | POA: Diagnosis not present

## 2019-12-30 DIAGNOSIS — Z9079 Acquired absence of other genital organ(s): Secondary | ICD-10-CM | POA: Diagnosis not present

## 2019-12-30 DIAGNOSIS — Z9884 Bariatric surgery status: Secondary | ICD-10-CM | POA: Diagnosis not present

## 2019-12-30 DIAGNOSIS — Z79899 Other long term (current) drug therapy: Secondary | ICD-10-CM | POA: Diagnosis not present

## 2019-12-30 DIAGNOSIS — K9589 Other complications of other bariatric procedure: Secondary | ICD-10-CM

## 2019-12-30 DIAGNOSIS — I10 Essential (primary) hypertension: Secondary | ICD-10-CM | POA: Diagnosis not present

## 2019-12-30 DIAGNOSIS — Z853 Personal history of malignant neoplasm of breast: Secondary | ICD-10-CM | POA: Diagnosis not present

## 2019-12-31 ENCOUNTER — Ambulatory Visit
Admission: RE | Admit: 2019-12-31 | Discharge: 2019-12-31 | Disposition: A | Discharge status: Home / Self Care | Payer: BC Managed Care – PPO | Source: Ambulatory Visit | Attending: Hematology and Oncology | Admitting: Hematology and Oncology

## 2019-12-31 DIAGNOSIS — Z1231 Encounter for screening mammogram for malignant neoplasm of breast: Secondary | ICD-10-CM | POA: Diagnosis not present

## 2019-12-31 DIAGNOSIS — C50911 Malignant neoplasm of unspecified site of right female breast: Secondary | ICD-10-CM | POA: Insufficient documentation

## 2020-03-30 ENCOUNTER — Inpatient Hospital Stay: Payer: BC Managed Care – PPO | Attending: Hematology and Oncology

## 2020-03-30 ENCOUNTER — Other Ambulatory Visit: Payer: Self-pay

## 2020-03-30 DIAGNOSIS — C50911 Malignant neoplasm of unspecified site of right female breast: Secondary | ICD-10-CM

## 2020-03-30 DIAGNOSIS — I10 Essential (primary) hypertension: Secondary | ICD-10-CM | POA: Insufficient documentation

## 2020-03-30 DIAGNOSIS — Z9884 Bariatric surgery status: Secondary | ICD-10-CM | POA: Insufficient documentation

## 2020-03-30 DIAGNOSIS — Z79899 Other long term (current) drug therapy: Secondary | ICD-10-CM | POA: Diagnosis not present

## 2020-03-30 DIAGNOSIS — Z853 Personal history of malignant neoplasm of breast: Secondary | ICD-10-CM | POA: Insufficient documentation

## 2020-03-30 DIAGNOSIS — Z803 Family history of malignant neoplasm of breast: Secondary | ICD-10-CM | POA: Diagnosis not present

## 2020-03-30 DIAGNOSIS — D508 Other iron deficiency anemias: Secondary | ICD-10-CM | POA: Diagnosis present

## 2020-03-30 DIAGNOSIS — Z9079 Acquired absence of other genital organ(s): Secondary | ICD-10-CM | POA: Diagnosis not present

## 2020-03-30 LAB — CBC WITH DIFFERENTIAL/PLATELET
Abs Immature Granulocytes: 0.02 10*3/uL (ref 0.00–0.07)
Basophils Absolute: 0.1 10*3/uL (ref 0.0–0.1)
Basophils Relative: 1 %
Eosinophils Absolute: 0.2 10*3/uL (ref 0.0–0.5)
Eosinophils Relative: 3 %
HCT: 36.2 % (ref 36.0–46.0)
Hemoglobin: 12.3 g/dL (ref 12.0–15.0)
Immature Granulocytes: 0 %
Lymphocytes Relative: 37 %
Lymphs Abs: 2.2 10*3/uL (ref 0.7–4.0)
MCH: 31.1 pg (ref 26.0–34.0)
MCHC: 34 g/dL (ref 30.0–36.0)
MCV: 91.6 fL (ref 80.0–100.0)
Monocytes Absolute: 0.6 10*3/uL (ref 0.1–1.0)
Monocytes Relative: 10 %
Neutro Abs: 2.9 10*3/uL (ref 1.7–7.7)
Neutrophils Relative %: 49 %
Platelets: 279 10*3/uL (ref 150–400)
RBC: 3.95 MIL/uL (ref 3.87–5.11)
RDW: 12.3 % (ref 11.5–15.5)
WBC: 5.9 10*3/uL (ref 4.0–10.5)
nRBC: 0 % (ref 0.0–0.2)

## 2020-03-30 LAB — FOLATE: Folate: 50.4 ng/mL (ref >5.9)

## 2020-03-30 LAB — VITAMIN B12: Vitamin B-12: 1036 pg/mL — ABNORMAL HIGH (ref 180–914)

## 2020-03-30 LAB — FERRITIN: Ferritin: 56 ng/mL (ref 11–307)

## 2020-06-28 ENCOUNTER — Other Ambulatory Visit: Payer: Self-pay

## 2020-06-28 ENCOUNTER — Inpatient Hospital Stay: Payer: BC Managed Care – PPO | Attending: Hematology and Oncology

## 2020-06-28 DIAGNOSIS — Z170 Estrogen receptor positive status [ER+]: Secondary | ICD-10-CM | POA: Diagnosis not present

## 2020-06-28 DIAGNOSIS — E785 Hyperlipidemia, unspecified: Secondary | ICD-10-CM | POA: Insufficient documentation

## 2020-06-28 DIAGNOSIS — Z90710 Acquired absence of both cervix and uterus: Secondary | ICD-10-CM | POA: Diagnosis not present

## 2020-06-28 DIAGNOSIS — I10 Essential (primary) hypertension: Secondary | ICD-10-CM | POA: Diagnosis not present

## 2020-06-28 DIAGNOSIS — K219 Gastro-esophageal reflux disease without esophagitis: Secondary | ICD-10-CM | POA: Diagnosis not present

## 2020-06-28 DIAGNOSIS — K9589 Other complications of other bariatric procedure: Secondary | ICD-10-CM

## 2020-06-28 DIAGNOSIS — Z9013 Acquired absence of bilateral breasts and nipples: Secondary | ICD-10-CM | POA: Diagnosis not present

## 2020-06-28 DIAGNOSIS — Z853 Personal history of malignant neoplasm of breast: Secondary | ICD-10-CM | POA: Diagnosis not present

## 2020-06-28 DIAGNOSIS — Z9884 Bariatric surgery status: Secondary | ICD-10-CM | POA: Insufficient documentation

## 2020-06-28 DIAGNOSIS — Z803 Family history of malignant neoplasm of breast: Secondary | ICD-10-CM | POA: Diagnosis not present

## 2020-06-28 DIAGNOSIS — G4730 Sleep apnea, unspecified: Secondary | ICD-10-CM | POA: Insufficient documentation

## 2020-06-28 DIAGNOSIS — C50911 Malignant neoplasm of unspecified site of right female breast: Secondary | ICD-10-CM

## 2020-06-28 DIAGNOSIS — Z79899 Other long term (current) drug therapy: Secondary | ICD-10-CM | POA: Insufficient documentation

## 2020-06-28 DIAGNOSIS — D509 Iron deficiency anemia, unspecified: Secondary | ICD-10-CM | POA: Insufficient documentation

## 2020-06-28 LAB — COMPREHENSIVE METABOLIC PANEL
ALT: 59 U/L — ABNORMAL HIGH (ref 0–44)
AST: 54 U/L — ABNORMAL HIGH (ref 15–41)
Albumin: 3.8 g/dL (ref 3.5–5.0)
Alkaline Phosphatase: 58 U/L (ref 38–126)
Anion gap: 10 (ref 5–15)
BUN: 13 mg/dL (ref 6–20)
CO2: 24 mmol/L (ref 22–32)
Calcium: 8.8 mg/dL — ABNORMAL LOW (ref 8.9–10.3)
Chloride: 103 mmol/L (ref 98–111)
Creatinine, Ser: 0.96 mg/dL (ref 0.44–1.00)
GFR calc Af Amer: >60 mL/min (ref >60)
GFR calc non Af Amer: >60 mL/min (ref >60)
Glucose, Bld: 99 mg/dL (ref 70–99)
Potassium: 3.8 mmol/L (ref 3.5–5.1)
Sodium: 137 mmol/L (ref 135–145)
Total Bilirubin: 0.5 mg/dL (ref 0.3–1.2)
Total Protein: 7 g/dL (ref 6.5–8.1)

## 2020-06-28 LAB — CBC WITH DIFFERENTIAL/PLATELET
Abs Immature Granulocytes: 0.01 10*3/uL (ref 0.00–0.07)
Basophils Absolute: 0 10*3/uL (ref 0.0–0.1)
Basophils Relative: 0 %
Eosinophils Absolute: 0.1 10*3/uL (ref 0.0–0.5)
Eosinophils Relative: 2 %
HCT: 36.4 % (ref 36.0–46.0)
Hemoglobin: 12.5 g/dL (ref 12.0–15.0)
Immature Granulocytes: 0 %
Lymphocytes Relative: 41 %
Lymphs Abs: 1.8 10*3/uL (ref 0.7–4.0)
MCH: 31.4 pg (ref 26.0–34.0)
MCHC: 34.3 g/dL (ref 30.0–36.0)
MCV: 91.5 fL (ref 80.0–100.0)
Monocytes Absolute: 0.5 10*3/uL (ref 0.1–1.0)
Monocytes Relative: 11 %
Neutro Abs: 2.1 10*3/uL (ref 1.7–7.7)
Neutrophils Relative %: 46 %
Platelets: 259 10*3/uL (ref 150–400)
RBC: 3.98 MIL/uL (ref 3.87–5.11)
RDW: 12.3 % (ref 11.5–15.5)
WBC: 4.5 10*3/uL (ref 4.0–10.5)
nRBC: 0 % (ref 0.0–0.2)

## 2020-06-28 LAB — FERRITIN: Ferritin: 100 ng/mL (ref 11–307)

## 2020-06-28 NOTE — Progress Notes (Signed)
[facility]  [address] [place], [place] [address] Phone: [phone]  Fax: [phone]   Clinic Day:  06/29/2020  Referring physician: Rusty Aus, MD  Chief Complaint: Jessica Hammond is a 59 y.o. female with s/p gastric bypass surgery, iron deficiency, and stage I right breast cancer who is seen for 6 month assessment.  HPI: The patient was last seen in the medical oncology clinic on 12/30/2019. At that time, she felt "pretty good".  Exam was stable. Hematocrit was 37.3, hemoglobin 12.6, platelets 310,000, WBC 5,900. CMP was normal. Ferritin was 68.  Screening mammogram on 12/31/2019 revealed no evidence of malignancy.  Labs have been followed: 03/30/2020: Hematocrit 36.2, hemoglobin 12.3, MCV 91.6, platelets 279,000, WBC 5,900. Ferritin   56. Vitamin B12 was 1,036 and folate 50.4.  06/28/2020: Hematocrit 36.4, hemoglobin 12.5, MCV 91.5, platelets 259,000, WBC 4,500. Ferritin 100.  During the interim, she has been "good." She takes oral iron once daily. This bothers her stomach a little bit but she is still able to tolerate it. She denies any new medications or herbal products. She denies known exposure to hepatitis. She has had her gallbladder removed.  She performs monthly breast self-exams and voices no concerns.    Past Medical History:  Diagnosis Date  . Cancer ([place]) 09/22/2008   rt breast  . GERD (gastroesophageal reflux disease)   . Hyperlipidemia   . Hypertension   . Pterygium eye, right   . Seasonal allergies     Past Surgical History:  Procedure Laterality Date  . ABDOMINAL HYSTERECTOMY     still has cervix  . AUGMENTATION MAMMAPLASTY  02/03/2009   tranflap  . BREAST EXCISIONAL BIOPSY Right 09/22/2008   lumpectomy +  . CHOLECYSTECTOMY    . COLONOSCOPY  2013  . COLONOSCOPY WITH PROPOFOL N/A 08/03/2016   Procedure: COLONOSCOPY WITH PROPOFOL;  Surgeon: [name], MD;  Location: [facility] ENDOSCOPY;  Service: Endoscopy;   Laterality: N/A;  . ESOPHAGOGASTRODUODENOSCOPY (EGD) WITH PROPOFOL N/A 08/03/2016   Procedure: ESOPHAGOGASTRODUODENOSCOPY (EGD) WITH PROPOFOL;  Surgeon: [name], MD;  Location: [facility] ENDOSCOPY;  Service: Endoscopy;  Laterality: N/A;  . GIVENS CAPSULE STUDY N/A 08/14/2016   Procedure: GIVENS CAPSULE STUDY;  Surgeon: [name], MD;  Location: [facility] ENDOSCOPY;  Service: Gastroenterology;  Laterality: N/A;  . INGUINAL HERNIA REPAIR Right   . LAPAROSCOPIC CHOLECYSTECTOMY W/ CHOLANGIOGRAPHY  1999  . LAPAROSCOPIC GASTRIC BYPASS  08/17/2013  . MASTECTOMY Bilateral 02/03/2009   bilat mast with tramflap recon., there is still breast tissue, no nipple sparring.   . sleep apnea surgery  2003    Family History  Problem Relation Age of Onset  . Breast cancer Mother 18  . Breast cancer Maternal Aunt 54       twin sister to mother    Social History:  reports that she has never smoked. She has never used smokeless tobacco. She reports current alcohol use. She reports that she does not use drugs. She works at [employer].  She lives in [place].  The patient is alone today.  Allergies: No Known Allergies  Current Medications: Current Outpatient Medications  Medication Sig Dispense Refill  . BIOTIN PO Take by mouth.    . Calcium Carbonate-Vitamin D (CALCIUM-VITAMIN D3 PO) Take by mouth. 600-400    . Cholecalciferol (VITAMIN D3) 25 MCG (1000 UT) CAPS Vitamin D3 25 mcg (1,000 unit) capsule  Take 1 capsule every day by oral route.    . Cyanocobalamin (VITAMIN B-12) 2500 MCG  SUBL Place under the tongue.     Marland Kitchen desloratadine (CLARINEX) 5 MG tablet Take 5 mg by mouth daily.     Marland Kitchen escitalopram (LEXAPRO) 10 MG tablet Take 10 mg by mouth daily.    Marland Kitchen lisinopril-hydrochlorothiazide (PRINZIDE,ZESTORETIC) 10-12.5 MG tablet Take by mouth daily.     . Multiple Vitamin (MULTI-VITAMINS) TABS Take by mouth.    . Omega-3 Fatty Acids (FISH OIL) 1000 MG CAPS Take by mouth.    . rosuvastatin (CRESTOR) 10 MG tablet Take  10 mg by mouth daily.    Marland Kitchen VITAMIN E PO Take 400 Units by mouth.      No current facility-administered medications for this visit.    Review of Systems  Constitutional: Negative.  Negative for chills, diaphoresis, fever, malaise/fatigue and weight loss (up 1 lb).       Feels "good".  HENT: Negative.  Negative for congestion, ear discharge, ear pain, hearing loss, nosebleeds, sinus pain, sore throat and tinnitus.   Eyes: Negative.  Negative for blurred vision and double vision.  Respiratory: Negative.  Negative for cough, hemoptysis, sputum production and shortness of breath.   Cardiovascular: Negative.  Negative for chest pain, palpitations and leg swelling.  Gastrointestinal: Negative.  Negative for abdominal pain, blood in stool, constipation, diarrhea, heartburn, melena, nausea and vomiting.  Genitourinary: Negative.  Negative for dysuria, frequency, hematuria and urgency.  Musculoskeletal: Negative.  Negative for back pain, joint pain, myalgias and neck pain.  Skin: Negative.  Negative for itching and rash.  Neurological: Negative.  Negative for dizziness, tingling, speech change, weakness and headaches.  Endo/Heme/Allergies: Negative.  Does not bruise/bleed easily.  Psychiatric/Behavioral: Negative.  Negative for depression and memory loss. The patient is not nervous/anxious and does not have insomnia.   All other systems reviewed and are negative.  Performance status (ECOG):  0  Vitals Blood pressure 114/74, pulse 66, temperature 97.9 F (36.6 C), temperature source Tympanic, resp. rate 18, weight 172 lb 13.5 oz (78.4 kg), SpO2 100 %.  Physical Exam Vitals and nursing note reviewed.  Constitutional:      General: She is not in acute distress.    Appearance: She is well-developed. She is not diaphoretic.  HENT:     Head: Normocephalic and atraumatic.     Comments: Styled shoulderlength blond hair.    Mouth/Throat:     Mouth: Mucous membranes are moist.     Pharynx:  Oropharynx is clear. No oropharyngeal exudate.  Eyes:     General: No scleral icterus.    Conjunctiva/sclera: Conjunctivae normal.     Pupils: Pupils are equal, round, and reactive to light.     Comments: Glasses.  Blue eyes.  Neck:     Vascular: No JVD.  Cardiovascular:     Rate and Rhythm: Normal rate and regular rhythm.     Heart sounds: Normal heart sounds. No murmur heard.   Pulmonary:     Effort: Pulmonary effort is normal. No respiratory distress.     Breath sounds: Normal breath sounds. No wheezing or rales.  Chest:     Chest wall: No tenderness.     Breasts:        Right: Skin change (scar tissue along incision, at 8 o clock) present.        Left: Skin change (mild scarring) present.  Abdominal:     General: Bowel sounds are normal.     Palpations: Abdomen is soft. There is no hepatomegaly, splenomegaly or mass.     Tenderness: There is  no abdominal tenderness. There is no guarding or rebound.  Musculoskeletal:        General: No tenderness. Normal range of motion.     Cervical back: Normal range of motion and neck supple.  Lymphadenopathy:     Head:     Right side of head: No preauricular, posterior auricular or occipital adenopathy.     Left side of head: No preauricular, posterior auricular or occipital adenopathy.     Cervical: No cervical adenopathy.     Upper Body:     Right upper body: No supraclavicular or axillary adenopathy.     Left upper body: No supraclavicular or axillary adenopathy.     Lower Body: No right inguinal adenopathy. No left inguinal adenopathy.  Skin:    General: Skin is warm and dry.     Coloration: Skin is not pale.     Findings: No erythema or rash.  Neurological:     Mental Status: She is alert and oriented to person, place, and time.  Psychiatric:        Behavior: Behavior normal.        Thought Content: Thought content normal.        Judgment: Judgment normal.    Appointment on 06/28/2020  Component Date Value Ref Range Status   . CA 27.29 06/28/2020 7.3  0.0 - 38.6 U/mL Final   Comment: (NOTE) Siemens Centaur Immunochemiluminometric Methodology [facility]) Values obtained with different assay methods or kits cannot be used interchangeably. Results cannot be interpreted as absolute evidence of the presence or absence of malignant disease. Performed At: [facility] [place], [place] [id number] Rush Farmer MD [id number]   . Ferritin 06/28/2020 100  11 - 307 ng/mL Final   Performed at [facility], [address] [place], [place] [address]  . Sodium 06/28/2020 137  135 - 145 mmol/L Final  . Potassium 06/28/2020 3.8  3.5 - 5.1 mmol/L Final  . Chloride 06/28/2020 103  98 - 111 mmol/L Final  . CO2 06/28/2020 24  22 - 32 mmol/L Final  . Glucose, Bld 06/28/2020 99  70 - 99 mg/dL Final   Glucose reference range applies only to samples taken after fasting for at least 8 hours.  . BUN 06/28/2020 13  6 - 20 mg/dL Final  . Creatinine, Ser 06/28/2020 0.96  0.44 - 1.00 mg/dL Final  . Calcium 06/28/2020 8.8* 8.9 - 10.3 mg/dL Final  . Total Protein 06/28/2020 7.0  6.5 - 8.1 g/dL Final  . Albumin 06/28/2020 3.8  3.5 - 5.0 g/dL Final  . AST 06/28/2020 54* 15 - 41 U/L Final  . ALT 06/28/2020 59* 0 - 44 U/L Final  . Alkaline Phosphatase 06/28/2020 58  38 - 126 U/L Final  . Total Bilirubin 06/28/2020 0.5  0.3 - 1.2 mg/dL Final  . GFR calc non Af Amer 06/28/2020 >60  >60 mL/min Final  . GFR calc Af Amer 06/28/2020 >60  >60 mL/min Final  . Anion gap 06/28/2020 10  5 - 15 Final   Performed at [facility], [address], [place], [place] [address]  . WBC 06/28/2020 4.5  4.0 - 10.5 K/uL Final  . RBC 06/28/2020 3.98  3.87 - 5.11 MIL/uL Final  . Hemoglobin 06/28/2020 12.5  12.0 - 15.0 g/dL Final  . HCT 06/28/2020 36.4  36 - 46 % Final  . MCV 06/28/2020 91.5  80.0 - 100.0 fL Final  . MCH 06/28/2020 31.4  26.0 - 34.0 pg  Final  . MCHC 06/28/2020 34.3  30.0 - 36.0 g/dL Final  . RDW  06/28/2020 12.3  11.5 - 15.5 % Final  . Platelets 06/28/2020 259  150 - 400 K/uL Final  . nRBC 06/28/2020 0.0  0.0 - 0.2 % Final  . Neutrophils Relative % 06/28/2020 46  % Final  . Neutro Abs 06/28/2020 2.1  1.7 - 7.7 K/uL Final  . Lymphocytes Relative 06/28/2020 41  % Final  . Lymphs Abs 06/28/2020 1.8  0.7 - 4.0 K/uL Final  . Monocytes Relative 06/28/2020 11  % Final  . Monocytes Absolute 06/28/2020 0.5  0 - 1 K/uL Final  . Eosinophils Relative 06/28/2020 2  % Final  . Eosinophils Absolute 06/28/2020 0.1  0 - 0 K/uL Final  . Basophils Relative 06/28/2020 0  % Final  . Basophils Absolute 06/28/2020 0.0  0 - 0 K/uL Final  . Immature Granulocytes 06/28/2020 0  % Final  . Abs Immature Granulocytes 06/28/2020 0.01  0.00 - 0.07 K/uL Final   Performed at [facility], [address], [place], [place] [address]    Assessment:  Jessica Hammond is a 59 y.o. female with stage I right breast cancer s/p lumpectomy and sentinel lymph node biopsy in 10/2008. 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 Pathologic stage was T1aN0. Tumor was ER positive, PR positive and HER-2 negative.  She underwent bilateral mastectomy and TRAM flap reconstruction on 02/03/2009 for positive margins.  She received 2 years of tamoxifen and then was switched to Aromasin in 04/2011. She discontinued Aromasin on 07/15/2014.  CA 27.29 has been followed: 17.4 in 09/22/2010, 18.7 on 12/24/2011, 14.3 on 06/25/2012, 15.5 on 01/14/2013, 17.4 on 07/15/2013, 5.2 on 07/15/2014, 17.2 on 07/20/2016, 18.7 on 05/16/2017, 16.6 on 11/22/2017, 17.8 on 12/02/2018, 10.2 on 06/10/2019, and 7.3 on 06/28/2020.  She receives mammograms yearly per Dr. Edwin Dada suggestion. Bilateral mammogram and right sided ultrasound on 11/08/2017 revealed 2 adjacent oil cyst at 9:30 o'clock right breast 8 cm from nipple. There was fat necrosis beneath the open  skin wound area at the right breast 9:30 o'clock 4 cm from nipple.  Exam revealed redness and skin breakdown in the lateral right breast at the 9 o'clock position.  Screening mammogram on 12/31/2019 revealed no evidence of malignancy.  She is s/p gastric bypass surgery on 08/17/2013 and subsequent iron deficiency.  Labs on 07/20/2016 revealed a ferritin 5 with a saturation of 7% and a TIBC of 505 (high).  B12 was 4249.  SPEP revealed no monoclonal protein.    She received Venofer 4 (11/21, 12/5, 12/12, and 09/18/2016), x 2 (05/23/2018 - 05/29/2018), and x2 (06/11/2019 and 06/19/2019). She receives Venofer if her ferritin is < 30.    Ferritin has been followed:  5 on 07/20/2016, 68 on 11/16/2016, 44 on 05/16/2017, 32 on 08/16/2017, 31 on 11/22/2017, 23 on 05/22/2018, 90 on 08/22/2018, 83 on 12/02/2018, 36 on 03/05/2019, 35 on 06/10/2019, 111 on 09/09/2019, 68 on 12/29/2019, 56 on 03/30/2020 and 100 on 06/28/2020.  She is on oral B12 supplementation.  B12 was 2917 on 05/16/2017. B12 and folate were normal on 03/30/2020.  EGD on 08/03/2016 was normal.  Colonscopy on 08/03/2016 was normal except for 3 mm sessile polyp in the cecum. Pathology revealed melanosis coli negative for dysplasia or malignancy.  Capsule endoscopy revealed no abnormality.  She has received the COVID-19 vaccine.  Symptomatically, she is doing well.  She denies any new medications.  She is taking oral iron daily.  She denies any exposure to hepatitis.  Exam is stable.  Plan: 1.   Review labs from 06/28/2020. 2.   Stage I right breast cancer Clinically, she continues to do well. Exam reveals no evidence of recurrent disease. CA 27.29 was 10.2 on 06/10/2019. She completed Aromasin in 07/2014. Screening mammogram on 12/31/2019 revealed no evidence of malignancy. Continue to monitor. 3.   Iron deficiency anemia  Symptomatically, she is doing well.   She is taking oral iron daily.   Discuss holding oral iron if  irritating. Hematocrit 36.4.  Hemoglobin 12.5. MCV 91.5.   Ferritin 100.   She receives Venofer if her ferritin is < 30.   No Venofer today. 4.   Increased LFTs  AST 54 and ALT 59.    Etiology is unclear.    Patient denies any new medications.  She denies any exposure to hepatitis.  She may have fatty liver. 5.   RTC in 2 weeks for labs (LFTs; hepatitis B core antibody, hepatitis Ce antibody). 6.   RN to call patient after labs in 2 weeks. 7.   Mammogram on 12/30/2020. 8.   RTC in 3 months and 6 months (CBC with diff, ferritin). 9.   RTC in 9 months for MD assessment, labs (CBC with diff, CMP, ferritin, CA 27.29 - day before), and +/- Venofer.  I discussed the assessment and treatment plan with the patient.  The patient was provided an opportunity to ask questions and all were answered.  The patient agreed with the plan and demonstrated an understanding of the instructions.  The patient was advised to call back if the symptoms worsen or if the condition fails to improve as anticipated.   Lequita Asal, MD, PhD    06/29/2020, 1:23 PM  I, Mirian Mo Tufford, am acting as a Education administrator for Lequita Asal, MD.  I, McGraw Mike Gip, MD, have reviewed the above documentation for accuracy and completeness, and I agree with the above.

## 2020-06-29 ENCOUNTER — Inpatient Hospital Stay: Payer: BC Managed Care – PPO

## 2020-06-29 ENCOUNTER — Inpatient Hospital Stay (HOSPITAL_BASED_OUTPATIENT_CLINIC_OR_DEPARTMENT_OTHER): Payer: BC Managed Care – PPO | Admitting: Hematology and Oncology

## 2020-06-29 ENCOUNTER — Encounter: Payer: Self-pay | Admitting: Hematology and Oncology

## 2020-06-29 VITALS — BP 114/74 | HR 66 | Temp 97.9°F | Resp 18 | Wt 172.8 lb

## 2020-06-29 DIAGNOSIS — Z853 Personal history of malignant neoplasm of breast: Secondary | ICD-10-CM | POA: Diagnosis not present

## 2020-06-29 DIAGNOSIS — D509 Iron deficiency anemia, unspecified: Secondary | ICD-10-CM

## 2020-06-29 DIAGNOSIS — C50911 Malignant neoplasm of unspecified site of right female breast: Secondary | ICD-10-CM

## 2020-06-29 DIAGNOSIS — D508 Other iron deficiency anemias: Secondary | ICD-10-CM

## 2020-06-29 DIAGNOSIS — R7989 Other specified abnormal findings of blood chemistry: Secondary | ICD-10-CM | POA: Diagnosis not present

## 2020-06-29 DIAGNOSIS — K9589 Other complications of other bariatric procedure: Secondary | ICD-10-CM

## 2020-06-29 LAB — CANCER ANTIGEN 27.29: CA 27.29: 7.3 U/mL (ref 0.0–38.6)

## 2020-06-29 NOTE — Progress Notes (Signed)
No new changes noted today 

## 2020-07-03 DIAGNOSIS — R7989 Other specified abnormal findings of blood chemistry: Secondary | ICD-10-CM | POA: Insufficient documentation

## 2020-07-13 ENCOUNTER — Inpatient Hospital Stay: Payer: BC Managed Care – PPO | Attending: Hematology and Oncology

## 2020-07-13 ENCOUNTER — Other Ambulatory Visit: Payer: Self-pay

## 2020-07-13 DIAGNOSIS — Z9079 Acquired absence of other genital organ(s): Secondary | ICD-10-CM | POA: Diagnosis not present

## 2020-07-13 DIAGNOSIS — Z9013 Acquired absence of bilateral breasts and nipples: Secondary | ICD-10-CM | POA: Diagnosis not present

## 2020-07-13 DIAGNOSIS — Z79899 Other long term (current) drug therapy: Secondary | ICD-10-CM | POA: Diagnosis not present

## 2020-07-13 DIAGNOSIS — Z853 Personal history of malignant neoplasm of breast: Secondary | ICD-10-CM | POA: Diagnosis not present

## 2020-07-13 DIAGNOSIS — R7989 Other specified abnormal findings of blood chemistry: Secondary | ICD-10-CM | POA: Insufficient documentation

## 2020-07-13 DIAGNOSIS — Z9884 Bariatric surgery status: Secondary | ICD-10-CM | POA: Insufficient documentation

## 2020-07-13 DIAGNOSIS — I10 Essential (primary) hypertension: Secondary | ICD-10-CM | POA: Insufficient documentation

## 2020-07-13 DIAGNOSIS — Z170 Estrogen receptor positive status [ER+]: Secondary | ICD-10-CM | POA: Diagnosis not present

## 2020-07-13 DIAGNOSIS — D508 Other iron deficiency anemias: Secondary | ICD-10-CM | POA: Diagnosis present

## 2020-07-13 DIAGNOSIS — C50911 Malignant neoplasm of unspecified site of right female breast: Secondary | ICD-10-CM

## 2020-07-13 LAB — CBC WITH DIFFERENTIAL/PLATELET
Abs Immature Granulocytes: 0.01 10*3/uL (ref 0.00–0.07)
Basophils Absolute: 0.1 10*3/uL (ref 0.0–0.1)
Basophils Relative: 1 %
Eosinophils Absolute: 0.1 10*3/uL (ref 0.0–0.5)
Eosinophils Relative: 2 %
HCT: 37 % (ref 36.0–46.0)
Hemoglobin: 12.3 g/dL (ref 12.0–15.0)
Immature Granulocytes: 0 %
Lymphocytes Relative: 35 %
Lymphs Abs: 2 10*3/uL (ref 0.7–4.0)
MCH: 31.1 pg (ref 26.0–34.0)
MCHC: 33.2 g/dL (ref 30.0–36.0)
MCV: 93.7 fL (ref 80.0–100.0)
Monocytes Absolute: 0.6 10*3/uL (ref 0.1–1.0)
Monocytes Relative: 11 %
Neutro Abs: 2.9 10*3/uL (ref 1.7–7.7)
Neutrophils Relative %: 51 %
Platelets: 312 10*3/uL (ref 150–400)
RBC: 3.95 MIL/uL (ref 3.87–5.11)
RDW: 12.3 % (ref 11.5–15.5)
WBC: 5.7 10*3/uL (ref 4.0–10.5)
nRBC: 0 % (ref 0.0–0.2)

## 2020-07-13 LAB — HEPATITIS B CORE ANTIBODY, TOTAL: Hep B Core Total Ab: NONREACTIVE

## 2020-07-13 LAB — HEPATIC FUNCTION PANEL
ALT: 28 U/L (ref 0–44)
AST: 20 U/L (ref 15–41)
Albumin: 3.9 g/dL (ref 3.5–5.0)
Alkaline Phosphatase: 48 U/L (ref 38–126)
Bilirubin, Direct: <0.1 mg/dL (ref 0.0–0.2)
Total Bilirubin: 0.4 mg/dL (ref 0.3–1.2)
Total Protein: 6.8 g/dL (ref 6.5–8.1)

## 2020-07-13 LAB — FERRITIN: Ferritin: 67 ng/mL (ref 11–307)

## 2020-07-13 LAB — HEPATITIS C ANTIBODY: HCV Ab: NONREACTIVE

## 2020-08-16 ENCOUNTER — Encounter: Payer: Self-pay | Admitting: Licensed Clinical Social Worker

## 2020-09-28 ENCOUNTER — Inpatient Hospital Stay: Payer: BC Managed Care – PPO

## 2020-12-28 ENCOUNTER — Other Ambulatory Visit: Payer: Self-pay

## 2020-12-28 ENCOUNTER — Inpatient Hospital Stay: Payer: BC Managed Care – PPO | Attending: Hematology and Oncology

## 2020-12-28 DIAGNOSIS — Z853 Personal history of malignant neoplasm of breast: Secondary | ICD-10-CM | POA: Diagnosis present

## 2020-12-28 DIAGNOSIS — D508 Other iron deficiency anemias: Secondary | ICD-10-CM | POA: Diagnosis not present

## 2020-12-28 DIAGNOSIS — C50911 Malignant neoplasm of unspecified site of right female breast: Secondary | ICD-10-CM

## 2020-12-28 DIAGNOSIS — Z9884 Bariatric surgery status: Secondary | ICD-10-CM | POA: Diagnosis not present

## 2020-12-28 LAB — CBC WITH DIFFERENTIAL/PLATELET
Abs Immature Granulocytes: 0.02 10*3/uL (ref 0.00–0.07)
Basophils Absolute: 0.1 10*3/uL (ref 0.0–0.1)
Basophils Relative: 1 %
Eosinophils Absolute: 0.1 10*3/uL (ref 0.0–0.5)
Eosinophils Relative: 2 %
HCT: 36.6 % (ref 36.0–46.0)
Hemoglobin: 12.5 g/dL (ref 12.0–15.0)
Immature Granulocytes: 0 %
Lymphocytes Relative: 31 %
Lymphs Abs: 1.9 10*3/uL (ref 0.7–4.0)
MCH: 31.5 pg (ref 26.0–34.0)
MCHC: 34.2 g/dL (ref 30.0–36.0)
MCV: 92.2 fL (ref 80.0–100.0)
Monocytes Absolute: 0.5 10*3/uL (ref 0.1–1.0)
Monocytes Relative: 8 %
Neutro Abs: 3.6 10*3/uL (ref 1.7–7.7)
Neutrophils Relative %: 58 %
Platelets: 304 10*3/uL (ref 150–400)
RBC: 3.97 MIL/uL (ref 3.87–5.11)
RDW: 12.6 % (ref 11.5–15.5)
WBC: 6.2 10*3/uL (ref 4.0–10.5)
nRBC: 0 % (ref 0.0–0.2)

## 2020-12-28 LAB — FERRITIN: Ferritin: 113 ng/mL (ref 11–307)

## 2021-02-13 ENCOUNTER — Other Ambulatory Visit: Payer: Self-pay | Admitting: Internal Medicine

## 2021-02-13 DIAGNOSIS — Z1231 Encounter for screening mammogram for malignant neoplasm of breast: Secondary | ICD-10-CM

## 2021-03-20 ENCOUNTER — Other Ambulatory Visit: Payer: Self-pay

## 2021-03-20 ENCOUNTER — Ambulatory Visit
Admission: RE | Admit: 2021-03-20 | Discharge: 2021-03-20 | Disposition: A | Discharge status: Home / Self Care | Payer: BC Managed Care – PPO | Source: Ambulatory Visit | Attending: Internal Medicine | Admitting: Internal Medicine

## 2021-03-20 DIAGNOSIS — Z1231 Encounter for screening mammogram for malignant neoplasm of breast: Secondary | ICD-10-CM | POA: Insufficient documentation

## 2021-03-23 ENCOUNTER — Other Ambulatory Visit: Payer: Self-pay

## 2021-03-23 DIAGNOSIS — D508 Other iron deficiency anemias: Secondary | ICD-10-CM

## 2021-03-23 DIAGNOSIS — K9589 Other complications of other bariatric procedure: Secondary | ICD-10-CM

## 2021-03-23 DIAGNOSIS — C50911 Malignant neoplasm of unspecified site of right female breast: Secondary | ICD-10-CM

## 2021-03-28 ENCOUNTER — Inpatient Hospital Stay: Payer: BC Managed Care – PPO | Attending: Nurse Practitioner

## 2021-03-28 ENCOUNTER — Other Ambulatory Visit: Payer: Self-pay

## 2021-03-28 DIAGNOSIS — G4730 Sleep apnea, unspecified: Secondary | ICD-10-CM | POA: Diagnosis not present

## 2021-03-28 DIAGNOSIS — I10 Essential (primary) hypertension: Secondary | ICD-10-CM | POA: Insufficient documentation

## 2021-03-28 DIAGNOSIS — Z79899 Other long term (current) drug therapy: Secondary | ICD-10-CM | POA: Insufficient documentation

## 2021-03-28 DIAGNOSIS — D508 Other iron deficiency anemias: Secondary | ICD-10-CM | POA: Insufficient documentation

## 2021-03-28 DIAGNOSIS — Z9013 Acquired absence of bilateral breasts and nipples: Secondary | ICD-10-CM | POA: Diagnosis not present

## 2021-03-28 DIAGNOSIS — Z853 Personal history of malignant neoplasm of breast: Secondary | ICD-10-CM | POA: Diagnosis not present

## 2021-03-28 DIAGNOSIS — E785 Hyperlipidemia, unspecified: Secondary | ICD-10-CM | POA: Insufficient documentation

## 2021-03-28 DIAGNOSIS — K219 Gastro-esophageal reflux disease without esophagitis: Secondary | ICD-10-CM | POA: Diagnosis not present

## 2021-03-28 DIAGNOSIS — Z803 Family history of malignant neoplasm of breast: Secondary | ICD-10-CM | POA: Insufficient documentation

## 2021-03-28 DIAGNOSIS — Z9884 Bariatric surgery status: Secondary | ICD-10-CM | POA: Diagnosis not present

## 2021-03-28 DIAGNOSIS — C50911 Malignant neoplasm of unspecified site of right female breast: Secondary | ICD-10-CM

## 2021-03-28 LAB — COMPREHENSIVE METABOLIC PANEL
ALT: 25 U/L (ref 0–44)
AST: 23 U/L (ref 15–41)
Albumin: 4 g/dL (ref 3.5–5.0)
Alkaline Phosphatase: 50 U/L (ref 38–126)
Anion gap: 9 (ref 5–15)
BUN: 18 mg/dL (ref 8–23)
CO2: 26 mmol/L (ref 22–32)
Calcium: 9.2 mg/dL (ref 8.9–10.3)
Chloride: 102 mmol/L (ref 98–111)
Creatinine, Ser: 0.99 mg/dL (ref 0.44–1.00)
GFR, Estimated: >60 mL/min (ref >60)
Glucose, Bld: 112 mg/dL — ABNORMAL HIGH (ref 70–99)
Potassium: 3.8 mmol/L (ref 3.5–5.1)
Sodium: 137 mmol/L (ref 135–145)
Total Bilirubin: 0.6 mg/dL (ref 0.3–1.2)
Total Protein: 7.2 g/dL (ref 6.5–8.1)

## 2021-03-28 LAB — CBC WITH DIFFERENTIAL/PLATELET
Abs Immature Granulocytes: 0.01 10*3/uL (ref 0.00–0.07)
Basophils Absolute: 0.1 10*3/uL (ref 0.0–0.1)
Basophils Relative: 1 %
Eosinophils Absolute: 0.2 10*3/uL (ref 0.0–0.5)
Eosinophils Relative: 3 %
HCT: 36.5 % (ref 36.0–46.0)
Hemoglobin: 12.6 g/dL (ref 12.0–15.0)
Immature Granulocytes: 0 %
Lymphocytes Relative: 35 %
Lymphs Abs: 1.9 10*3/uL (ref 0.7–4.0)
MCH: 31.4 pg (ref 26.0–34.0)
MCHC: 34.5 g/dL (ref 30.0–36.0)
MCV: 91 fL (ref 80.0–100.0)
Monocytes Absolute: 0.5 10*3/uL (ref 0.1–1.0)
Monocytes Relative: 10 %
Neutro Abs: 2.8 10*3/uL (ref 1.7–7.7)
Neutrophils Relative %: 51 %
Platelets: 301 10*3/uL (ref 150–400)
RBC: 4.01 MIL/uL (ref 3.87–5.11)
RDW: 12.2 % (ref 11.5–15.5)
WBC: 5.5 10*3/uL (ref 4.0–10.5)
nRBC: 0 % (ref 0.0–0.2)

## 2021-03-28 LAB — FERRITIN: Ferritin: 100 ng/mL (ref 11–307)

## 2021-03-29 ENCOUNTER — Inpatient Hospital Stay: Payer: BC Managed Care – PPO

## 2021-03-29 ENCOUNTER — Inpatient Hospital Stay: Payer: BC Managed Care – PPO | Admitting: Oncology

## 2021-03-29 ENCOUNTER — Encounter: Payer: Self-pay | Admitting: Oncology

## 2021-03-29 VITALS — BP 122/74 | HR 69 | Temp 98.1°F | Resp 20 | Wt 177.0 lb

## 2021-03-29 DIAGNOSIS — K9589 Other complications of other bariatric procedure: Secondary | ICD-10-CM

## 2021-03-29 DIAGNOSIS — Z08 Encounter for follow-up examination after completed treatment for malignant neoplasm: Secondary | ICD-10-CM | POA: Diagnosis not present

## 2021-03-29 DIAGNOSIS — Z86000 Personal history of in-situ neoplasm of breast: Secondary | ICD-10-CM

## 2021-03-29 DIAGNOSIS — D508 Other iron deficiency anemias: Secondary | ICD-10-CM | POA: Diagnosis not present

## 2021-03-29 LAB — CANCER ANTIGEN 27.29: CA 27.29: 10.6 U/mL (ref 0.0–38.6)

## 2021-03-29 NOTE — Progress Notes (Signed)
Hematology/Oncology Consult note [facility]  Telephone:([phone] [phone]  Patient Care Team: Rusty Aus, MD as PCP - General (Internal Medicine) Lequita Asal, MD as PCP - Hematology/Oncology (Hematology and Oncology) Lequita Asal, MD as Consulting Physician (Hematology and Oncology)   Name of the patient: Jessica Hammond  [id number]  Aug 27, 1961   Date of visit: 03/29/21  Diagnosis-history of iron deficiency anemia s/p gastric bypass  Stage I right breast cancer in 2010 s/p bilateral mastectomy with TRAM flap reconstruction.  Chief complaint/ Reason for visit-routine follow-up of iron deficiency anemia and breast cancer  Heme/Onc history: Patient is a 60 year old female diagnosed with stage I right breast cancer in January 2010.  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  T1 a M0.  ER/PR positive HER2 negative.  She is s/p lumpectomy and sentinel lymph node biopsy in January 2010.  She then underwent a bilateral mastectomy with TRAM flap reconstruction in May 2010 for positive margins.  She initially took tamoxifen and was then switched to Aromasin which was ultimately discontinued in October 2015.  She still gets bilateral mammograms every year as per her plastic surgeon's recommendation given that there was residual breast tissue around the TRAM flap.  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  Colonscopy on 08/03/2016 was normal except for 3 mm sessile polyp in the cecum. Pathology revealed melanosis coli negative for dysplasia or malignancy.  Capsule endoscopy revealed no abnormality.  Interval history-patient reports doing well presently and denies any specific complaints at this time.  Appetite and weight have remained stable  ECOG PS- 1 Pain scale- 0   Review of  systems- Review of Systems  Constitutional:  Negative for chills, fever, malaise/fatigue and weight loss.  HENT:  Negative for congestion, ear discharge and nosebleeds.   Eyes:  Negative for blurred vision.  Respiratory:  Negative for cough, hemoptysis, sputum production, shortness of breath and wheezing.   Cardiovascular:  Negative for chest pain, palpitations, orthopnea and claudication.  Gastrointestinal:  Negative for abdominal pain, blood in stool, constipation, diarrhea, heartburn, melena, nausea and vomiting.  Genitourinary:  Negative for dysuria, flank pain, frequency, hematuria and urgency.  Musculoskeletal:  Negative for back pain, joint pain and myalgias.  Skin:  Negative for rash.  Neurological:  Negative for dizziness, tingling, focal weakness, seizures, weakness and headaches.  Endo/Heme/Allergies:  Does not bruise/bleed easily.  Psychiatric/Behavioral:  Negative for depression and suicidal ideas. The patient does not have insomnia.     No Known Allergies   Past Medical History:  Diagnosis Date   Cancer ([place]) 09/22/2008   rt breast   GERD (gastroesophageal reflux disease)    Hyperlipidemia    Hypertension    Pterygium eye, right    Seasonal allergies      Past Surgical History:  Procedure Laterality Date   ABDOMINAL HYSTERECTOMY     still has cervix   AUGMENTATION MAMMAPLASTY  02/03/2009   tranflap   BREAST EXCISIONAL BIOPSY Right 09/22/2008   lumpectomy +   CHOLECYSTECTOMY     COLONOSCOPY  2013   COLONOSCOPY WITH PROPOFOL N/A 08/03/2016   Procedure: COLONOSCOPY WITH PROPOFOL;  Surgeon: [name], MD;  Location: [facility] ENDOSCOPY;  Service: Endoscopy;  Laterality: N/A;   ESOPHAGOGASTRODUODENOSCOPY (EGD) WITH PROPOFOL N/A 08/03/2016   Procedure: ESOPHAGOGASTRODUODENOSCOPY (EGD) WITH PROPOFOL;  Surgeon: [name], MD;  Location: [facility] ENDOSCOPY;  Service: Endoscopy;  Laterality: N/A;   GIVENS CAPSULE STUDY N/A 08/14/2016   Procedure: GIVENS CAPSULE STUDY;  Surgeon:  [name], MD;  Location: [facility] ENDOSCOPY;  Service: Gastroenterology;  Laterality: N/A;   INGUINAL HERNIA REPAIR Right    LAPAROSCOPIC CHOLECYSTECTOMY W/ CHOLANGIOGRAPHY  1999   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    sleep apnea surgery  2003    Social History   Socioeconomic History   Marital status: Married    Spouse name: Not on file   Number of children: Not on file   Years of education: Not on file   Highest education level: Not on file  Occupational History   Not on file  Tobacco Use   Smoking status: Never   Smokeless tobacco: Never  Substance and Sexual Activity   Alcohol use: Yes    Alcohol/week: 0.0 standard drinks    Comment: occasional consumption   Drug use: No   Sexual activity: Not on file  Other Topics Concern   Not on file  Social History Narrative   Not on file   Social Determinants of Health   Financial Resource Strain: Not on file  Food Insecurity: Not on file  Transportation Needs: Not on file  Physical Activity: Not on file  Stress: Not on file  Social Connections: Not on file  Intimate Partner Violence: Not on file    Family History  Problem Relation Age of Onset   Breast cancer Mother 38   Breast cancer Maternal Aunt 48       twin sister to mother     Current Outpatient Medications:    BIOTIN PO, Take by mouth., Disp: , Rfl:    Calcium Carbonate-Vitamin D (CALCIUM-VITAMIN D3 PO), Take by mouth. 600-400, Disp: , Rfl:    Cholecalciferol (VITAMIN D3) 25 MCG (1000 UT) CAPS, Vitamin D3 25 mcg (1,000 unit) capsule  Take 1 capsule every day by oral route., Disp: , Rfl:    Cyanocobalamin (VITAMIN B-12) 2500 MCG SUBL, Place under the tongue. , Disp: , Rfl:    desloratadine (CLARINEX) 5 MG tablet, Take 5 mg by mouth daily. , Disp: , Rfl:    escitalopram (LEXAPRO) 10 MG tablet, Take 10 mg by mouth daily., Disp: , Rfl:     ferrous sulfate 324 MG TBEC, Take 324 mg by mouth., Disp: , Rfl:    lisinopril-hydrochlorothiazide (PRINZIDE,ZESTORETIC) 10-12.5 MG tablet, Take by mouth daily. , Disp: , Rfl:    Multiple Vitamin (MULTI-VITAMINS) TABS, Take by mouth., Disp: , Rfl:    Omega-3 Fatty Acids (FISH OIL) 1000 MG CAPS, Take by mouth., Disp: , Rfl:    rosuvastatin (CRESTOR) 10 MG tablet, Take 10 mg by mouth daily., Disp: , Rfl:    VITAMIN E PO, Take 400 Units by mouth. , Disp: , Rfl:   Physical exam:  Vitals:   03/29/21 1335  BP: 122/74  Pulse: 69  Resp: 20  Temp: 98.1 F (36.7 C)  SpO2: 98%  Weight: 177 lb 0.5 oz (80.3 kg)   Physical Exam Cardiovascular:     Rate and Rhythm: Normal rate and regular rhythm.     Heart sounds: Normal heart sounds.  Pulmonary:     Effort: Pulmonary effort is normal.     Breath sounds: Normal breath sounds.  Abdominal:     General: Bowel sounds are normal.  Palpations: Abdomen is soft.  Skin:    General: Skin is warm and dry.  Neurological:     Mental Status: She is alert and oriented to person, place, and time.  Breast exam: Patient is s/p bilateral mastectomy with TRAM flap reconstruction.  No palpable chest wall masses.  No palpable bilateral axillary adenopathy.  Left breast appears more shrunken as compared to right.  Chronic  CMP Latest Ref Rng & Units 03/28/2021  Glucose 70 - 99 mg/dL 112(H)  BUN 8 - 23 mg/dL 18  Creatinine 0.44 - 1.00 mg/dL 0.99  Sodium 135 - 145 mmol/L 137  Potassium 3.5 - 5.1 mmol/L 3.8  Chloride 98 - 111 mmol/L 102  CO2 22 - 32 mmol/L 26  Calcium 8.9 - 10.3 mg/dL 9.2  Total Protein 6.5 - 8.1 g/dL 7.2  Total Bilirubin 0.3 - 1.2 mg/dL 0.6  Alkaline Phos 38 - 126 U/L 50  AST 15 - 41 U/L 23  ALT 0 - 44 U/L 25   CBC Latest Ref Rng & Units 03/28/2021  WBC 4.0 - 10.5 K/uL 5.5  Hemoglobin 12.0 - 15.0 g/dL 12.6  Hematocrit 36.0 - 46.0 % 36.5  Platelets 150 - 400 K/uL 301    No images are attached to the encounter.  MM 3D SCREEN BREAST  BILATERAL  Result Date: 03/21/2021 CLINICAL DATA:  Screening. EXAM: DIGITAL SCREENING BILATERAL MAMMOGRAM WITH TOMOSYNTHESIS AND CAD TECHNIQUE: Bilateral screening digital craniocaudal and mediolateral oblique mammograms were obtained. Bilateral screening digital breast tomosynthesis was performed. The images were evaluated with computer-aided detection. COMPARISON:  Previous exam(s). ACR Breast Density Category a: The breast tissue is almost entirely fatty. FINDINGS: There are no findings suspicious for malignancy. IMPRESSION: No mammographic evidence of malignancy. A result letter of this screening mammogram will be mailed directly to the patient. RECOMMENDATION: Screening mammogram in one year. (Code:SM-B-01Y) BI-RADS CATEGORY  1: Negative. Electronically Signed   By: Kristopher Oppenheim M.D.   On: 03/21/2021 13:23    Assessment and plan- Patient is a 60 y.o. female with history of stage I invasive mammary carcinoma of the right breast in 2010 s/p bilateral mastectomy with TRAM flap reconstruction and previously on Aromasin here for routine follow-up  History of stage I breast cancer: She is now more than 5 years since her diagnosis.  She will continue to get yearly mammograms given that she has residual breast tissue around her TRAM flap reconstruction.  No role for checking routine tumor markers and I will not be doing that moving forward.  2.  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  Patient is presently not anemic with a stable hemoglobin of around 12.  Ferritin levels are normal at 100.  She does not require any IV iron at this time.  Repeat CBC ferritin and iron studies and B12 in 6 months in 1 year and I will see her back in 1 year   Visit Diagnosis 1. Iron deficiency anemia following bariatric surgery   2. 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 Randa Evens, MD, MPH [facility] at [facility] [phone] 03/29/2021 3:18 PM

## 2021-09-21 ENCOUNTER — Telehealth: Payer: Self-pay

## 2021-09-21 NOTE — Telephone Encounter (Signed)
Call pt with new appt  in [place]. Pt aware of the move. lrt

## 2021-09-27 ENCOUNTER — Inpatient Hospital Stay: Payer: BC Managed Care – PPO | Attending: Oncology

## 2021-09-28 ENCOUNTER — Other Ambulatory Visit: Payer: BC Managed Care – PPO

## 2022-03-28 ENCOUNTER — Inpatient Hospital Stay: Payer: BC Managed Care – PPO | Attending: Oncology

## 2022-03-28 ENCOUNTER — Ambulatory Visit: Payer: BC Managed Care – PPO | Admitting: Oncology

## 2022-03-28 ENCOUNTER — Inpatient Hospital Stay: Payer: BC Managed Care – PPO | Admitting: Oncology

## 2022-03-28 ENCOUNTER — Other Ambulatory Visit: Payer: BC Managed Care – PPO

## 2022-03-28 ENCOUNTER — Encounter: Payer: Self-pay | Admitting: Oncology

## 2022-03-28 VITALS — BP 115/73 | HR 66 | Temp 98.2°F | Resp 16 | Wt 166.9 lb

## 2022-03-28 DIAGNOSIS — D509 Iron deficiency anemia, unspecified: Secondary | ICD-10-CM | POA: Diagnosis not present

## 2022-03-28 DIAGNOSIS — Z9884 Bariatric surgery status: Secondary | ICD-10-CM | POA: Insufficient documentation

## 2022-03-28 DIAGNOSIS — Z853 Personal history of malignant neoplasm of breast: Secondary | ICD-10-CM

## 2022-03-28 DIAGNOSIS — D508 Other iron deficiency anemias: Secondary | ICD-10-CM | POA: Diagnosis present

## 2022-03-28 DIAGNOSIS — K9589 Other complications of other bariatric procedure: Secondary | ICD-10-CM

## 2022-03-28 DIAGNOSIS — Z79899 Other long term (current) drug therapy: Secondary | ICD-10-CM | POA: Diagnosis not present

## 2022-03-28 DIAGNOSIS — Z08 Encounter for follow-up examination after completed treatment for malignant neoplasm: Secondary | ICD-10-CM | POA: Diagnosis not present

## 2022-03-28 DIAGNOSIS — Z9013 Acquired absence of bilateral breasts and nipples: Secondary | ICD-10-CM | POA: Diagnosis not present

## 2022-03-28 LAB — CBC WITH DIFFERENTIAL/PLATELET
Abs Immature Granulocytes: 0.01 10*3/uL (ref 0.00–0.07)
Basophils Absolute: 0.1 10*3/uL (ref 0.0–0.1)
Basophils Relative: 1 %
Eosinophils Absolute: 0.1 10*3/uL (ref 0.0–0.5)
Eosinophils Relative: 2 %
HCT: 38.6 % (ref 36.0–46.0)
Hemoglobin: 12.8 g/dL (ref 12.0–15.0)
Immature Granulocytes: 0 %
Lymphocytes Relative: 34 %
Lymphs Abs: 2.1 10*3/uL (ref 0.7–4.0)
MCH: 31.4 pg (ref 26.0–34.0)
MCHC: 33.2 g/dL (ref 30.0–36.0)
MCV: 94.8 fL (ref 80.0–100.0)
Monocytes Absolute: 0.6 10*3/uL (ref 0.1–1.0)
Monocytes Relative: 9 %
Neutro Abs: 3.4 10*3/uL (ref 1.7–7.7)
Neutrophils Relative %: 54 %
Platelets: 304 10*3/uL (ref 150–400)
RBC: 4.07 MIL/uL (ref 3.87–5.11)
RDW: 12.3 % (ref 11.5–15.5)
WBC: 6.3 10*3/uL (ref 4.0–10.5)
nRBC: 0 % (ref 0.0–0.2)

## 2022-03-28 LAB — IRON AND TIBC
Iron: 87 ug/dL (ref 28–170)
Saturation Ratios: 23 % (ref 10.4–31.8)
TIBC: 381 ug/dL (ref 250–450)
UIBC: 294 ug/dL

## 2022-03-28 LAB — FERRITIN: Ferritin: 74 ng/mL (ref 11–307)

## 2022-03-28 LAB — VITAMIN B12: Vitamin B-12: 3398 pg/mL — ABNORMAL HIGH (ref 180–914)

## 2022-03-31 ENCOUNTER — Encounter: Payer: Self-pay | Admitting: Oncology

## 2022-03-31 NOTE — Progress Notes (Signed)
Hematology/Oncology Consult note [facility]  Telephone:([phone] [phone]  Patient Care Team: Rusty Aus, MD as PCP - General (Internal Medicine) Lequita Asal, MD (Inactive) as PCP - Hematology/Oncology (Hematology and Oncology) Lequita Asal, MD (Inactive) as Consulting Physician (Hematology and Oncology)   Name of the patient: Jessica Hammond  [id number]  1960/11/04   Date of visit: 03/31/22  Diagnosis- history of iron deficiency anemia s/p gastric bypass  Stage I right breast cancer in 2010 s/p bilateral mastectomy with TRAM flap reconstruction.  Chief complaint/ Reason for visit-also visit for breast cancer and routine follow-up of iron deficiency  Heme/Onc history: Patient is a 61 year old female diagnosed with stage I right breast cancer in January 2010.  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  T1 a M0.  ER/PR positive HER2 negative.  She is s/p lumpectomy and sentinel lymph node biopsy in January 2010.  She then underwent a bilateral mastectomy with TRAM flap reconstruction in May 2010 for positive margins.  She initially took tamoxifen and was then switched to Aromasin which was ultimately discontinued in October 2015.  She still gets bilateral mammograms every year as per her plastic surgeon's recommendation given that there was residual breast tissue around the TRAM flap.   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  Colonscopy on 08/03/2016 was normal except for 3 mm sessile polyp in the cecum. Pathology revealed melanosis coli negative for dysplasia or malignancy.  Capsule endoscopy revealed no abnormality.    Interval history-patient reports some ongoing fatigue.  Denies any blood lossIn stool or urine.  ECOG PS- 0 Pain scale- 0   Review of systems- Review of  Systems  Constitutional:  Positive for malaise/fatigue. Negative for chills, fever and weight loss.  HENT:  Negative for congestion, ear discharge and nosebleeds.   Eyes:  Negative for blurred vision.  Respiratory:  Negative for cough, hemoptysis, sputum production, shortness of breath and wheezing.   Cardiovascular:  Negative for chest pain, palpitations, orthopnea and claudication.  Gastrointestinal:  Negative for abdominal pain, blood in stool, constipation, diarrhea, heartburn, melena, nausea and vomiting.  Genitourinary:  Negative for dysuria, flank pain, frequency, hematuria and urgency.  Musculoskeletal:  Negative for back pain, joint pain and myalgias.  Skin:  Negative for rash.  Neurological:  Negative for dizziness, tingling, focal weakness, seizures, weakness and headaches.  Endo/Heme/Allergies:  Does not bruise/bleed easily.  Psychiatric/Behavioral:  Negative for depression and suicidal ideas. The patient does not have insomnia.       No Known Allergies   Past Medical History:  Diagnosis Date   Cancer ([place]) 09/22/2008   rt breast   GERD (gastroesophageal reflux disease)    Hyperlipidemia    Hypertension    Pterygium eye, right    Seasonal allergies      Past Surgical History:  Procedure Laterality Date   ABDOMINAL HYSTERECTOMY     still has cervix   AUGMENTATION MAMMAPLASTY  02/03/2009   tranflap   BREAST EXCISIONAL BIOPSY Right 09/22/2008   lumpectomy +   CHOLECYSTECTOMY     COLONOSCOPY  2013   COLONOSCOPY WITH PROPOFOL N/A 08/03/2016   Procedure: COLONOSCOPY WITH PROPOFOL;  Surgeon: [name], MD;  Location: [facility] ENDOSCOPY;  Service: Endoscopy;  Laterality: N/A;   ESOPHAGOGASTRODUODENOSCOPY (EGD) WITH PROPOFOL N/A 08/03/2016   Procedure: ESOPHAGOGASTRODUODENOSCOPY (EGD) WITH PROPOFOL;  Surgeon: [name], MD;  Location: [facility] ENDOSCOPY;  Service: Endoscopy;  Laterality: N/A;   GIVENS CAPSULE STUDY N/A 08/14/2016   Procedure: GIVENS CAPSULE STUDY;  Surgeon:  [name], MD;  Location: [facility] ENDOSCOPY;  Service: Gastroenterology;  Laterality: N/A;   INGUINAL HERNIA REPAIR Right    LAPAROSCOPIC CHOLECYSTECTOMY W/ CHOLANGIOGRAPHY  1999   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    sleep apnea surgery  2003    Social History   Socioeconomic History   Marital status: Married    Spouse name: Not on file   Number of children: Not on file   Years of education: Not on file   Highest education level: Not on file  Occupational History   Not on file  Tobacco Use   Smoking status: Never   Smokeless tobacco: Never  Substance and Sexual Activity   Alcohol use: Yes    Alcohol/week: 0.0 standard drinks of alcohol    Comment: occasional consumption   Drug use: No   Sexual activity: Not on file  Other Topics Concern   Not on file  Social History Narrative   Not on file   Social Determinants of Health   Financial Resource Strain: Not on file  Food Insecurity: Not on file  Transportation Needs: Not on file  Physical Activity: Not on file  Stress: Not on file  Social Connections: Not on file  Intimate Partner Violence: Not on file    Family History  Problem Relation Age of Onset   Breast cancer Mother 51   Breast cancer Maternal Aunt 72       twin sister to mother     Current Outpatient Medications:    BIOTIN PO, Take by mouth., Disp: , Rfl:    Calcium Carbonate-Vitamin D (CALCIUM-VITAMIN D3 PO), Take by mouth. 600-400, Disp: , Rfl:    Cholecalciferol (VITAMIN D3) 25 MCG (1000 UT) CAPS, Vitamin D3 25 mcg (1,000 unit) capsule  Take 1 capsule every day by oral route., Disp: , Rfl:    Cyanocobalamin (VITAMIN B-12) 2500 MCG SUBL, Place under the tongue. , Disp: , Rfl:    desloratadine (CLARINEX) 5 MG tablet, Take 5 mg by mouth daily. , Disp: , Rfl:    escitalopram (LEXAPRO) 10 MG tablet, Take 10 mg by mouth daily., Disp: ,  Rfl:    ferrous sulfate 324 MG TBEC, Take 324 mg by mouth., Disp: , Rfl:    lisinopril-hydrochlorothiazide (PRINZIDE,ZESTORETIC) 10-12.5 MG tablet, Take by mouth daily. , Disp: , Rfl:    Multiple Vitamin (MULTI-VITAMINS) TABS, Take by mouth., Disp: , Rfl:    Omega-3 Fatty Acids (FISH OIL) 1000 MG CAPS, Take by mouth., Disp: , Rfl:    rosuvastatin (CRESTOR) 10 MG tablet, Take 10 mg by mouth daily., Disp: , Rfl:    triamterene-hydrochlorothiazide (MAXZIDE-25) 37.5-25 MG tablet, Take 1 tablet by mouth daily., Disp: , Rfl:    VITAMIN E PO, Take 400 Units by mouth. , Disp: , Rfl:   Physical exam:  Vitals:   03/28/22 1410  BP: 115/73  Pulse: 66  Resp: 16  Temp: 98.2 F (36.8 C)  SpO2: 99%  Weight: 166 lb 14.4 oz (75.7 kg)   Physical Exam Constitutional:      General: She is not in acute distress. Cardiovascular:     Rate and Rhythm: Normal rate and regular rhythm.     Heart sounds: Normal  heart sounds.  Pulmonary:     Effort: Pulmonary effort is normal.     Breath sounds: Normal breath sounds.  Skin:    General: Skin is warm and dry.  Neurological:     Mental Status: She is alert and oriented to person, place, and time.   Breast exam: Patient is s/p bilateral mastectomy with TRAM flap construction.  No palpable masses in either breast.  No palpable bilateral axillary adenopathy     Latest Ref Rng & Units 03/28/2021    8:39 AM  CMP  Glucose 70 - 99 mg/dL 112   BUN 8 - 23 mg/dL 18   Creatinine 0.44 - 1.00 mg/dL 0.99   Sodium 135 - 145 mmol/L 137   Potassium 3.5 - 5.1 mmol/L 3.8   Chloride 98 - 111 mmol/L 102   CO2 22 - 32 mmol/L 26   Calcium 8.9 - 10.3 mg/dL 9.2   Total Protein 6.5 - 8.1 g/dL 7.2   Total Bilirubin 0.3 - 1.2 mg/dL 0.6   Alkaline Phos 38 - 126 U/L 50   AST 15 - 41 U/L 23   ALT 0 - 44 U/L 25       Latest Ref Rng & Units 03/28/2022    1:53 PM  CBC  WBC 4.0 - 10.5 K/uL 6.3   Hemoglobin 12.0 - 15.0 g/dL 12.8   Hematocrit 36.0 - 46.0 % 38.6   Platelets 150  - 400 K/uL 304       Assessment and plan- Patient is a 61 y.o. female who is here for follow-up of following issues  Surveillance breast cancer:She is now over 12 years from the diagnosis of her breast cancer.  Clinically patient is doing well with no concerning signs and symptoms of recurrence based on today's exam.  She is overdue for mammogram at this time which I will schedule.  Iron deficiency anemia: S/p gastric bypass patient is not presently anemic with an H&H of 12.8/38.6.  Ferritin levels are normal at 34 within iron saturation of 23%.  B12 levels are normal.  She does not require any IV iron or B12 at this time.  We will repeat labs in 6 months in 1 year and I will see her back in 1 year   Visit Diagnosis 1. Encounter for follow-up surveillance of breast cancer   2. Iron deficiency anemia, unspecified iron deficiency anemia type      Dr. Randa Evens, MD, MPH [facility] at [facility] [phone] 03/31/2022 8:28 PM

## 2022-04-23 ENCOUNTER — Ambulatory Visit
Admission: RE | Admit: 2022-04-23 | Discharge: 2022-04-23 | Disposition: A | Discharge status: Home / Self Care | Payer: BC Managed Care – PPO | Source: Ambulatory Visit | Attending: Oncology | Admitting: Oncology

## 2022-04-23 DIAGNOSIS — Z1231 Encounter for screening mammogram for malignant neoplasm of breast: Secondary | ICD-10-CM | POA: Insufficient documentation

## 2022-04-23 DIAGNOSIS — Z853 Personal history of malignant neoplasm of breast: Secondary | ICD-10-CM | POA: Diagnosis present

## 2022-04-23 DIAGNOSIS — Z08 Encounter for follow-up examination after completed treatment for malignant neoplasm: Secondary | ICD-10-CM | POA: Insufficient documentation

## 2022-08-31 ENCOUNTER — Other Ambulatory Visit: Payer: Self-pay

## 2022-08-31 ENCOUNTER — Inpatient Hospital Stay
Admission: EM | Admit: 2022-08-31 | Discharge: 2022-09-05 | DRG: 392 | Disposition: A | Discharge status: Home / Self Care | Payer: BC Managed Care – PPO | Attending: Family Medicine | Admitting: Family Medicine

## 2022-08-31 ENCOUNTER — Emergency Department: Payer: BC Managed Care – PPO

## 2022-08-31 DIAGNOSIS — E876 Hypokalemia: Secondary | ICD-10-CM | POA: Diagnosis not present

## 2022-08-31 DIAGNOSIS — F32A Depression, unspecified: Secondary | ICD-10-CM | POA: Insufficient documentation

## 2022-08-31 DIAGNOSIS — I10 Essential (primary) hypertension: Secondary | ICD-10-CM

## 2022-08-31 DIAGNOSIS — K5732 Diverticulitis of large intestine without perforation or abscess without bleeding: Secondary | ICD-10-CM | POA: Diagnosis not present

## 2022-08-31 DIAGNOSIS — E785 Hyperlipidemia, unspecified: Secondary | ICD-10-CM

## 2022-08-31 DIAGNOSIS — Z9013 Acquired absence of bilateral breasts and nipples: Secondary | ICD-10-CM

## 2022-08-31 DIAGNOSIS — Z853 Personal history of malignant neoplasm of breast: Secondary | ICD-10-CM

## 2022-08-31 DIAGNOSIS — K5792 Diverticulitis of intestine, part unspecified, without perforation or abscess without bleeding: Secondary | ICD-10-CM | POA: Diagnosis not present

## 2022-08-31 DIAGNOSIS — N7092 Oophoritis, unspecified: Secondary | ICD-10-CM

## 2022-08-31 DIAGNOSIS — K219 Gastro-esophageal reflux disease without esophagitis: Secondary | ICD-10-CM

## 2022-08-31 DIAGNOSIS — Z803 Family history of malignant neoplasm of breast: Secondary | ICD-10-CM

## 2022-08-31 DIAGNOSIS — Z79899 Other long term (current) drug therapy: Secondary | ICD-10-CM

## 2022-08-31 LAB — COMPREHENSIVE METABOLIC PANEL
ALT: 22 U/L (ref 0–44)
AST: 17 U/L (ref 15–41)
Albumin: 4 g/dL (ref 3.5–5.0)
Alkaline Phosphatase: 53 U/L (ref 38–126)
Anion gap: 10 (ref 5–15)
BUN: 14 mg/dL (ref 8–23)
CO2: 27 mmol/L (ref 22–32)
Calcium: 9 mg/dL (ref 8.9–10.3)
Chloride: 101 mmol/L (ref 98–111)
Creatinine, Ser: 0.84 mg/dL (ref 0.44–1.00)
GFR, Estimated: >60 mL/min (ref >60)
Glucose, Bld: 111 mg/dL — ABNORMAL HIGH (ref 70–99)
Potassium: 3.9 mmol/L (ref 3.5–5.1)
Sodium: 138 mmol/L (ref 135–145)
Total Bilirubin: 1 mg/dL (ref 0.3–1.2)
Total Protein: 7.3 g/dL (ref 6.5–8.1)

## 2022-08-31 LAB — CBC
HCT: 37.3 % (ref 36.0–46.0)
Hemoglobin: 12.4 g/dL (ref 12.0–15.0)
MCH: 31.4 pg (ref 26.0–34.0)
MCHC: 33.2 g/dL (ref 30.0–36.0)
MCV: 94.4 fL (ref 80.0–100.0)
Platelets: 300 10*3/uL (ref 150–400)
RBC: 3.95 MIL/uL (ref 3.87–5.11)
RDW: 11.8 % (ref 11.5–15.5)
WBC: 11.8 10*3/uL — ABNORMAL HIGH (ref 4.0–10.5)
nRBC: 0 % (ref 0.0–0.2)

## 2022-08-31 LAB — URINALYSIS, ROUTINE W REFLEX MICROSCOPIC
Bacteria, UA: NONE SEEN
Bilirubin Urine: NEGATIVE
Glucose, UA: NEGATIVE mg/dL
Hgb urine dipstick: NEGATIVE
Ketones, ur: 5 mg/dL — AB
Leukocytes,Ua: NEGATIVE
Nitrite: NEGATIVE
Protein, ur: 30 mg/dL — AB
Specific Gravity, Urine: 1.021 (ref 1.005–1.030)
pH: 5 (ref 5.0–8.0)

## 2022-08-31 LAB — LIPASE, BLOOD: Lipase: 28 U/L (ref 11–51)

## 2022-08-31 LAB — LACTIC ACID, PLASMA: Lactic Acid, Venous: 1.2 mmol/L (ref 0.5–1.9)

## 2022-08-31 MED ORDER — MORPHINE SULFATE (PF) 4 MG/ML IV SOLN
4.0000 mg | Freq: Once | INTRAVENOUS | Status: AC
  Administered 2022-08-31: 4 mg via INTRAVENOUS
  Filled 2022-08-31: qty 1

## 2022-08-31 MED ORDER — ONDANSETRON HCL 4 MG/2ML IJ SOLN
4.0000 mg | Freq: Once | INTRAMUSCULAR | Status: AC
  Administered 2022-08-31: 4 mg via INTRAVENOUS
  Filled 2022-08-31: qty 2

## 2022-08-31 MED ORDER — PIPERACILLIN-TAZOBACTAM IN DEX 2-0.25 GM/50ML IV SOLN
2.2500 g | Freq: Once | INTRAVENOUS | Status: AC
  Administered 2022-09-01: 2.25 g via INTRAVENOUS
  Filled 2022-08-31: qty 50

## 2022-08-31 MED ORDER — IOHEXOL 300 MG/ML  SOLN
100.0000 mL | Freq: Once | INTRAMUSCULAR | Status: AC | PRN
  Administered 2022-08-31: 100 mL via INTRAVENOUS

## 2022-08-31 NOTE — ED Provider Notes (Signed)
[facility] Provider Note  Patient Contact: 9:59 PM (approximate)   History   Abdominal Pain   HPI  Jessica Hammond is a 61 y.o. female with a history of hyperlipidemia, hypertension, GERD and diverticulitis presents to the emergency department with right and left lower quadrant abdominal pain over the past 24 hours without associated nausea or vomiting.  Patient states that she has never required admission for diverticulitis in the past.  No diarrhea.  Patient states that her pain is kept her from sleeping well last night.  No chest pain, chest tightness or shortness of breath.      Physical Exam   Triage Vital Signs: ED Triage Vitals [08/31/22 1651]  Enc Vitals Group     BP 121/74     Pulse Rate 75     Resp 18     Temp (!) 100.6 F (38.1 C)     Temp Source Oral     SpO2 98 %     Weight 166 lb (75.3 kg)     Height '4\' 10"'$  (1.473 m)     Head Circumference      Peak Flow      Pain Score 8     Pain Loc      Pain Edu?      Excl. in [place]?     Most recent vital signs: Vitals:   08/31/22 2111 08/31/22 2340  BP: 116/75 (!) 103/59  Pulse: 77 71  Resp: 17 16  Temp: 99.8 F (37.7 C) 99 F (37.2 C)  SpO2: 98% 97%     General: Alert and in no acute distress. Eyes:  PERRL. EOMI. Head: No acute traumatic findings ENT:      Nose: No congestion/rhinnorhea.      Mouth/Throat: Mucous membranes are moist. Neck: No stridor. No cervical spine tenderness to palpation. Cardiovascular:  Good peripheral perfusion Respiratory: Normal respiratory effort without tachypnea or retractions. Lungs CTAB. Good air entry to the bases with no decreased or absent breath sounds. Gastrointestinal: Bowel sounds 4 quadrants. Soft and nontender to palpation. No guarding or rigidity. No palpable masses. No distention. No CVA tenderness. Musculoskeletal: Full range of motion to all extremities.  Neurologic:  No gross focal neurologic deficits are appreciated.  Skin:   No  rash noted Other:   ED Results / Procedures / Treatments   Labs (all labs ordered are listed, but only abnormal results are displayed) Labs Reviewed  CBC - Abnormal; Notable for the following components:      Result Value   WBC 11.8 (*)    All other components within normal limits  URINALYSIS, ROUTINE W REFLEX MICROSCOPIC - Abnormal; Notable for the following components:   Color, Urine YELLOW (*)    APPearance HAZY (*)    Ketones, ur 5 (*)    Protein, ur 30 (*)    All other components within normal limits  COMPREHENSIVE METABOLIC PANEL - Abnormal; Notable for the following components:   Glucose, Bld 111 (*)    All other components within normal limits  LACTIC ACID, PLASMA  LIPASE, BLOOD  BASIC METABOLIC PANEL  CBC  HIV ANTIBODY (ROUTINE TESTING W REFLEX)       RADIOLOGY  I personally viewed and evaluated these images as part of my medical decision making, as well as reviewing the written report by the radiologist.  ED Provider Interpretation: CT abdomen pelvis indicates sigmoid diverticulitis and prominent left ovary with stranding.   PROCEDURES:  Critical Care performed: No  Procedures   MEDICATIONS ORDERED IN ED: Medications  piperacillin-tazobactam (ZOSYN) IVPB 2.25 g (2.25 g Intravenous New Bag/Given 09/01/22 0004)  lisinopril-hydrochlorothiazide (ZESTORETIC) 10-12.5 MG per tablet 1 tablet (has no administration in time range)  rosuvastatin (CRESTOR) tablet 10 mg (has no administration in time range)  triamterene-hydrochlorothiazide (MAXZIDE-25) 37.5-25 MG per tablet 1 tablet (has no administration in time range)  escitalopram (LEXAPRO) tablet 10 mg (has no administration in time range)  ferrous sulfate EC tablet 324 mg (has no administration in time range)  enoxaparin (LOVENOX) injection 40 mg (has no administration in time range)  0.9 %  sodium chloride infusion (has no administration in time range)  acetaminophen (TYLENOL) tablet 650 mg (has no  administration in time range)    Or  acetaminophen (TYLENOL) suppository 650 mg (has no administration in time range)  traZODone (DESYREL) tablet 25 mg (has no administration in time range)  magnesium hydroxide (MILK OF MAGNESIA) suspension 30 mL (has no administration in time range)  ondansetron (ZOFRAN) tablet 4 mg (has no administration in time range)    Or  ondansetron (ZOFRAN) injection 4 mg (has no administration in time range)  iohexol (OMNIPAQUE) 300 MG/ML solution 100 mL (100 mLs Intravenous Contrast Given 08/31/22 2012)  morphine (PF) 4 MG/ML injection 4 mg (4 mg Intravenous Given 08/31/22 2207)  ondansetron (ZOFRAN) injection 4 mg (4 mg Intravenous Given 08/31/22 2207)     IMPRESSION / MDM / ASSESSMENT AND PLAN / ED COURSE  I reviewed the triage vital signs and the nursing notes.                              Assessment and plan Abdominal pain 61 year old female presents to the emergency department with left lower quadrant abdominal pain for the past 24 hours.  Patient had low-grade fever at triage but vital signs otherwise reassuring.  On exam, patient was alert, active and nontoxic-appearing.  She had very mildly elevated white blood cell count.  CMP within reference range.  Lactic within reference range.  Urinalysis shows no signs of UTI.  CT abdomen pelvis indicated sigmoid diverticulitis and left ovary appeared enlarged with surrounding inflammation.  Will obtain dedicated ultrasound and will reassess.  Dedicated pelvic ultrasound showed nonvisualization of either ovary.  I reached out to radiologist who read CT abdomen pelvis, Dr. Garey Ham.  Very much appreciate time and consult.  Dr. Garey Ham stated that she could visualize both ovaries and confirmed that left ovary appeared larger than right with surrounding stranding and considered an infectious versus inflammatory process in her differential.  I reviewed patient case with attending, Dr. Cherylann Banas.  We agreed to reach out  to OB/GYN, Dr. Ouida Sills for consult.    I specifically reviewed presenting symptoms and imaging findings with Dr. Ouida Sills. Dr.Schermerhorn stated "it sounds like the patient needs to be admitted for diverticulitis".  Patient was given IV Zosyn in the emergency department.  My differential diagnosis at this time includes diverticulitis versus ovarian abscess.  Will admit to the hospitalist service for IV antibiotics and observation.  Patient was accepted to the hospitalist service under the care of Dr. Sidney Ace.   Clinical Course as of 09/01/22 0017  Fri Aug 31, 2022  2327 Lactic acid, plasma [JW]    Clinical Course User Index [JW] Lannie Fields, PA-C     FINAL CLINICAL IMPRESSION(S) / ED DIAGNOSES   Final diagnoses:  Diverticulitis     Rx / DC Orders  ED Discharge Orders     None        Note:  This document was prepared using Dragon voice recognition software and may include unintentional dictation errors.   Vallarie Mare Perham, PA-C 09/01/22 [place]    Nathaniel Man, MD 09/01/22 2113

## 2022-08-31 NOTE — ED Triage Notes (Signed)
Patient sent from [place] clinic for fever and lower abd pain. Reports hx of diverticulitis.  Reports feeling swollen in her lower abd.

## 2022-09-01 DIAGNOSIS — I10 Essential (primary) hypertension: Secondary | ICD-10-CM

## 2022-09-01 DIAGNOSIS — F32A Depression, unspecified: Secondary | ICD-10-CM | POA: Insufficient documentation

## 2022-09-01 DIAGNOSIS — K5732 Diverticulitis of large intestine without perforation or abscess without bleeding: Secondary | ICD-10-CM | POA: Diagnosis present

## 2022-09-01 DIAGNOSIS — Z79899 Other long term (current) drug therapy: Secondary | ICD-10-CM | POA: Diagnosis not present

## 2022-09-01 DIAGNOSIS — K219 Gastro-esophageal reflux disease without esophagitis: Secondary | ICD-10-CM | POA: Diagnosis present

## 2022-09-01 DIAGNOSIS — E785 Hyperlipidemia, unspecified: Secondary | ICD-10-CM

## 2022-09-01 DIAGNOSIS — Z803 Family history of malignant neoplasm of breast: Secondary | ICD-10-CM | POA: Diagnosis not present

## 2022-09-01 DIAGNOSIS — Z9013 Acquired absence of bilateral breasts and nipples: Secondary | ICD-10-CM | POA: Diagnosis not present

## 2022-09-01 DIAGNOSIS — N7092 Oophoritis, unspecified: Secondary | ICD-10-CM | POA: Diagnosis present

## 2022-09-01 DIAGNOSIS — Z853 Personal history of malignant neoplasm of breast: Secondary | ICD-10-CM | POA: Diagnosis not present

## 2022-09-01 DIAGNOSIS — E876 Hypokalemia: Secondary | ICD-10-CM | POA: Diagnosis not present

## 2022-09-01 DIAGNOSIS — K5792 Diverticulitis of intestine, part unspecified, without perforation or abscess without bleeding: Secondary | ICD-10-CM | POA: Diagnosis present

## 2022-09-01 LAB — BASIC METABOLIC PANEL
Anion gap: 8 (ref 5–15)
BUN: 19 mg/dL (ref 8–23)
CO2: 28 mmol/L (ref 22–32)
Calcium: 8.4 mg/dL — ABNORMAL LOW (ref 8.9–10.3)
Chloride: 101 mmol/L (ref 98–111)
Creatinine, Ser: 0.96 mg/dL (ref 0.44–1.00)
GFR, Estimated: >60 mL/min (ref >60)
Glucose, Bld: 112 mg/dL — ABNORMAL HIGH (ref 70–99)
Potassium: 3.5 mmol/L (ref 3.5–5.1)
Sodium: 137 mmol/L (ref 135–145)

## 2022-09-01 LAB — CBC
HCT: 34.1 % — ABNORMAL LOW (ref 36.0–46.0)
Hemoglobin: 11.2 g/dL — ABNORMAL LOW (ref 12.0–15.0)
MCH: 31.5 pg (ref 26.0–34.0)
MCHC: 32.8 g/dL (ref 30.0–36.0)
MCV: 95.8 fL (ref 80.0–100.0)
Platelets: 258 10*3/uL (ref 150–400)
RBC: 3.56 MIL/uL — ABNORMAL LOW (ref 3.87–5.11)
RDW: 11.9 % (ref 11.5–15.5)
WBC: 9.8 10*3/uL (ref 4.0–10.5)
nRBC: 0 % (ref 0.0–0.2)

## 2022-09-01 LAB — HIV ANTIBODY (ROUTINE TESTING W REFLEX): HIV Screen 4th Generation wRfx: NONREACTIVE

## 2022-09-01 MED ORDER — ROSUVASTATIN CALCIUM 10 MG PO TABS
10.0000 mg | ORAL_TABLET | Freq: Every day | ORAL | Status: DC
  Administered 2022-09-01 – 2022-09-05 (×5): 10 mg via ORAL
  Filled 2022-09-01 (×5): qty 1

## 2022-09-01 MED ORDER — LISINOPRIL 10 MG PO TABS
10.0000 mg | ORAL_TABLET | Freq: Every day | ORAL | Status: DC
  Administered 2022-09-01 – 2022-09-05 (×5): 10 mg via ORAL
  Filled 2022-09-01 (×5): qty 1

## 2022-09-01 MED ORDER — ESCITALOPRAM OXALATE 10 MG PO TABS
10.0000 mg | ORAL_TABLET | Freq: Every day | ORAL | Status: DC
  Administered 2022-09-01 – 2022-09-05 (×5): 10 mg via ORAL
  Filled 2022-09-01 (×5): qty 1

## 2022-09-01 MED ORDER — ONDANSETRON HCL 4 MG PO TABS: 4.0000 mg | ORAL_TABLET | Freq: Four times a day (QID) | ORAL | Status: DC | PRN

## 2022-09-01 MED ORDER — MAGNESIUM HYDROXIDE 400 MG/5ML PO SUSP: 30.0000 mL | Freq: Every day | ORAL | Status: DC | PRN

## 2022-09-01 MED ORDER — LISINOPRIL-HYDROCHLOROTHIAZIDE 10-12.5 MG PO TABS: 1.0000 | ORAL_TABLET | Freq: Every day | ORAL | Status: DC

## 2022-09-01 MED ORDER — OXYCODONE HCL 5 MG PO TABS
5.0000 mg | ORAL_TABLET | Freq: Four times a day (QID) | ORAL | Status: DC | PRN
  Administered 2022-09-01 (×2): 5 mg via ORAL
  Filled 2022-09-01 (×2): qty 1

## 2022-09-01 MED ORDER — HYDROCHLOROTHIAZIDE 12.5 MG PO TABS
12.5000 mg | ORAL_TABLET | Freq: Every day | ORAL | Status: DC
  Administered 2022-09-01 – 2022-09-05 (×5): 12.5 mg via ORAL
  Filled 2022-09-01 (×5): qty 1

## 2022-09-01 MED ORDER — SODIUM CHLORIDE 0.9 % IV SOLN: INTRAVENOUS | Status: DC

## 2022-09-01 MED ORDER — ENOXAPARIN SODIUM 40 MG/0.4ML IJ SOSY
40.0000 mg | PREFILLED_SYRINGE | INTRAMUSCULAR | Status: DC
  Administered 2022-09-01 – 2022-09-05 (×5): 40 mg via SUBCUTANEOUS
  Filled 2022-09-01 (×5): qty 0.4

## 2022-09-01 MED ORDER — TRAZODONE HCL 50 MG PO TABS: 25.0000 mg | ORAL_TABLET | Freq: Every evening | ORAL | Status: DC | PRN

## 2022-09-01 MED ORDER — TRIAMTERENE-HCTZ 37.5-25 MG PO TABS
1.0000 | ORAL_TABLET | Freq: Every day | ORAL | Status: DC
  Administered 2022-09-01: 1 via ORAL
  Filled 2022-09-01 (×2): qty 1

## 2022-09-01 MED ORDER — FERROUS SULFATE 325 (65 FE) MG PO TABS
324.0000 mg | ORAL_TABLET | Freq: Every day | ORAL | Status: DC
  Administered 2022-09-01 – 2022-09-05 (×4): 324 mg via ORAL
  Filled 2022-09-01 (×4): qty 1

## 2022-09-01 MED ORDER — METRONIDAZOLE 500 MG/100ML IV SOLN
500.0000 mg | Freq: Three times a day (TID) | INTRAVENOUS | Status: DC
  Administered 2022-09-01 – 2022-09-05 (×14): 500 mg via INTRAVENOUS
  Filled 2022-09-01 (×15): qty 100

## 2022-09-01 MED ORDER — SODIUM CHLORIDE 0.9 % IV SOLN
2.0000 g | INTRAVENOUS | Status: DC
  Administered 2022-09-01 – 2022-09-05 (×5): 2 g via INTRAVENOUS
  Filled 2022-09-01 (×4): qty 2
  Filled 2022-09-01: qty 20

## 2022-09-01 MED ORDER — ACETAMINOPHEN 325 MG PO TABS
650.0000 mg | ORAL_TABLET | Freq: Four times a day (QID) | ORAL | Status: DC | PRN
  Administered 2022-09-01 – 2022-09-03 (×3): 650 mg via ORAL
  Filled 2022-09-01 (×3): qty 2

## 2022-09-01 MED ORDER — HYDROMORPHONE HCL 1 MG/ML IJ SOLN: 0.5000 mg | INTRAMUSCULAR | Status: DC | PRN

## 2022-09-01 MED ORDER — ONDANSETRON HCL 4 MG/2ML IJ SOLN
4.0000 mg | Freq: Four times a day (QID) | INTRAMUSCULAR | Status: DC | PRN
  Administered 2022-09-02: 4 mg via INTRAVENOUS
  Filled 2022-09-01: qty 2

## 2022-09-01 MED ORDER — ACETAMINOPHEN 650 MG RE SUPP: 650.0000 mg | Freq: Four times a day (QID) | RECTAL | Status: DC | PRN

## 2022-09-01 NOTE — Assessment & Plan Note (Addendum)
Continue Lexapro

## 2022-09-01 NOTE — Assessment & Plan Note (Addendum)
-   Continue home regimen 

## 2022-09-01 NOTE — Assessment & Plan Note (Addendum)
Not on medication management.

## 2022-09-01 NOTE — H&P (Addendum)
[place]   PATIENT NAME: Jessica Hammond    MR#:  [id number]  DATE OF BIRTH:  [birth year]  DATE OF ADMISSION:  08/31/2022  PRIMARY CARE PHYSICIAN: Rusty Aus, MD   Patient is coming from: Home  REQUESTING/REFERRING PHYSICIAN: Lannie Fields, PA-C   CHIEF COMPLAINT:   Chief Complaint  Patient presents with   Abdominal Pain    HISTORY OF PRESENT ILLNESS:  Jessica Hammond is a 61 y.o. Caucasian female with medical history significant for hypertension, dyslipidemia, and GERD, presented to the emergency room with acute onset of left lower quadrant and suprapubic abdominal pain with associated constipation recently.  Her pain started on Thursday.  She denies any dysuria, oliguria, hematuria, urinary frequency or urgency or flank pain.  No cough or wheezing or dyspnea.  No chest pain or palpitations.  No diarrhea or melena or bright red bleeding per rectum.  She denies any nausea or vomiting.  ED Course: When she came to the ER, temperature was 100.6 with otherwise normal vital signs.  Labs revealed unremarkable CMP.  CBC showed WBC of 11.8.  UA was unremarkable.  Imaging: Abdominal pelvic CT scan revealed acute sigmoid: Diverticulitis without perforation or abscess.  Left ovary.  Prominent in size with surrounding inflammation and recommendation for pelvic ultrasound.  It also showed trace free fluid in the pelvis.  Pelvic ultrasound did not show ovaries and it showed previous hysterectomy.  This was compared with prior pelvic ultrasound which also did not show ovaries.    The ED provider reached out to the radiologist who read CT abdomen pelvis and Dr. Garey Ham confirmed that she could see both ovaries. They are higher in the pelvis. She considered an infectious versus inflammatory process of the left ovary and stated that sigmoid colon was only inflamed in area near left ovary. I reached out to OB/GYN, Dr. Ouida Sills who feels as though patient has diverticulitis and stated that he  would be available for consult if patient did not improve with IV antibiotics. He stated that small ovarian abscesses would be treated with IV antibiotics regardless.   The patient was given 4 mg of IV morphine sulfate and 4 mg of IV Zofran as well as IV Zosyn.  She will be admitted to a medical bed for further evaluation and management.  PAST MEDICAL HISTORY:   Past Medical History:  Diagnosis Date   Cancer ([place]) 09/22/2008   rt breast   GERD (gastroesophageal reflux disease)    Hyperlipidemia    Hypertension    Pterygium eye, right    Seasonal allergies     PAST SURGICAL HISTORY:   Past Surgical History:  Procedure Laterality Date   ABDOMINAL HYSTERECTOMY     still has cervix   AUGMENTATION MAMMAPLASTY  02/03/2009   tranflap   BREAST EXCISIONAL BIOPSY Right 09/22/2008   lumpectomy +   CHOLECYSTECTOMY     COLONOSCOPY  2013   COLONOSCOPY WITH PROPOFOL N/A 08/03/2016   Procedure: COLONOSCOPY WITH PROPOFOL;  Surgeon: [name], MD;  Location: [facility] ENDOSCOPY;  Service: Endoscopy;  Laterality: N/A;   ESOPHAGOGASTRODUODENOSCOPY (EGD) WITH PROPOFOL N/A 08/03/2016   Procedure: ESOPHAGOGASTRODUODENOSCOPY (EGD) WITH PROPOFOL;  Surgeon: [name], MD;  Location: [facility] ENDOSCOPY;  Service: Endoscopy;  Laterality: N/A;   GIVENS CAPSULE STUDY N/A 08/14/2016   Procedure: GIVENS CAPSULE STUDY;  Surgeon: [name], MD;  Location: [facility] ENDOSCOPY;  Service: Gastroenterology;  Laterality: N/A;   INGUINAL HERNIA REPAIR Right    LAPAROSCOPIC CHOLECYSTECTOMY  W/ CHOLANGIOGRAPHY  1999   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    sleep apnea surgery  2003    SOCIAL HISTORY:   Social History   Tobacco Use   Smoking status: Never   Smokeless tobacco: Never  Substance Use Topics   Alcohol use: Yes    Alcohol/week: 0.0 standard drinks of alcohol    Comment: occasional consumption     FAMILY HISTORY:   Family History  Problem Relation Age of Onset   Breast cancer Mother 7   Breast cancer Maternal Aunt 95       twin sister to mother    DRUG ALLERGIES:  No Known Allergies  REVIEW OF SYSTEMS:   ROS As per history of present illness. All pertinent systems were reviewed above. Constitutional, HEENT, cardiovascular, respiratory, GI, GU, musculoskeletal, neuro, psychiatric, endocrine, integumentary and hematologic systems were reviewed and are otherwise negative/unremarkable except for positive findings mentioned above in the HPI.   MEDICATIONS AT HOME:   Prior to Admission medications   Medication Sig Start Date End Date Taking? Authorizing Provider  BIOTIN PO Take by mouth.    [provider]  Calcium Carbonate-Vitamin D (CALCIUM-VITAMIN D3 PO) Take by mouth. 600-400    [provider]  Cholecalciferol (VITAMIN D3) 25 MCG (1000 UT) CAPS Vitamin D3 25 mcg (1,000 unit) capsule  Take 1 capsule every day by oral route.    [provider]  Cyanocobalamin (VITAMIN B-12) 2500 MCG SUBL Place under the tongue.     [provider]  desloratadine (CLARINEX) 5 MG tablet Take 5 mg by mouth daily.  05/04/15   [provider]  escitalopram (LEXAPRO) 10 MG tablet Take 10 mg by mouth daily. 10/14/19   [provider]  ferrous sulfate 324 MG TBEC Take 324 mg by mouth.    [provider]  lisinopril-hydrochlorothiazide (PRINZIDE,ZESTORETIC) 10-12.5 MG tablet Take by mouth daily.  11/27/16 03/28/22  [provider]  Multiple Vitamin (MULTI-VITAMINS) TABS Take by mouth.    [provider]  Omega-3 Fatty Acids (FISH OIL) 1000 MG CAPS Take by mouth.    [provider]  rosuvastatin (CRESTOR) 10 MG tablet Take 10 mg by mouth daily. 11/21/19   [provider]  triamterene-hydrochlorothiazide (MAXZIDE-25) 37.5-25 MG tablet Take 1 tablet by mouth daily.    [provider]  VITAMIN E PO  Take 400 Units by mouth.     [provider]      VITAL SIGNS:  Blood pressure (!) 100/56, pulse 67, temperature 98.8 F (37.1 C), temperature source Oral, resp. rate 16, height '4\' 10"'$  (1.473 m), weight 75.3 kg, SpO2 94 %.  PHYSICAL EXAMINATION:  Physical Exam  GENERAL:  61 y.o.-year-old patient lying in the bed with no acute distress.  EYES: Pupils equal, round, reactive to light and accommodation. No scleral icterus. Extraocular muscles intact.  HEENT: Head atraumatic, normocephalic. Oropharynx and nasopharynx clear.  NECK:  Supple, no jugular venous distention. No thyroid enlargement, no tenderness.  LUNGS: Normal breath sounds bilaterally, no wheezing, rales,rhonchi or crepitation. No use of accessory muscles of respiration.  CARDIOVASCULAR: Regular rate and rhythm, S1, S2 normal. No murmurs, rubs, or gallops.  ABDOMEN: Soft, nondistended, with left lower quadrant and suprapubic tenderness without rebound tenderness guarding or rigidity. Bowel sounds present. No organomegaly or mass.  EXTREMITIES: No pedal edema, cyanosis, or clubbing.  NEUROLOGIC: Cranial nerves  II through XII are intact. Muscle strength 5/5 in all extremities. Sensation intact. Gait not checked.  PSYCHIATRIC: The patient is alert and oriented x 3.  Normal affect and good eye contact. SKIN: No obvious rash, lesion, or ulcer.   LABORATORY PANEL:   CBC Recent Labs  Lab 09/01/22 0516  WBC 9.8  HGB 11.2*  HCT 34.1*  PLT 258   ------------------------------------------------------------------------------------------------------------------  Chemistries  Recent Labs  Lab 08/31/22 1700  NA 138  K 3.9  CL 101  CO2 27  GLUCOSE 111*  BUN 14  CREATININE 0.84  CALCIUM 9.0  AST 17  ALT 22  ALKPHOS 53  BILITOT 1.0   ------------------------------------------------------------------------------------------------------------------  Cardiac Enzymes No results for input(s): "TROPONINI" in the last  168 hours. ------------------------------------------------------------------------------------------------------------------  RADIOLOGY:  CT Abdomen Pelvis W Contrast  Addendum Date: 08/31/2022   ADDENDUM REPORT: 08/31/2022 23:40 ADDENDUM: Additional Findings: Patient is status post hysterectomy. The right ovary appears within normal limits. The left ovary is asymmetrically mildly enlarged and there is surrounding fluid and stranding. Findings may be related to infectious or inflammatory process involving the left ovary. Torsion would also be in the differential in the appropriate clinical setting. Recommend clinical correlation and follow-up. These results were called by telephone at the time of interpretation on 08/31/2022 at 11:39 pm to provider [facility] , who verbally acknowledged these results. Electronically Signed   By: Ronney Asters M.D.   On: 08/31/2022 23:40   Result Date: 08/31/2022 CLINICAL DATA:  Left lower quadrant pain. EXAM: CT ABDOMEN AND PELVIS WITH CONTRAST TECHNIQUE: Multidetector CT imaging of the abdomen and pelvis was performed using the standard protocol following bolus administration of intravenous contrast. RADIATION DOSE REDUCTION: This exam was performed according to the departmental dose-optimization program which includes automated exposure control, adjustment of the mA and/or kV according to patient size and/or use of iterative reconstruction technique. CONTRAST:  123m OMNIPAQUE IOHEXOL 300 MG/ML  SOLN COMPARISON:  None Available. FINDINGS: Lower chest: No acute abnormality. Hepatobiliary: No focal liver abnormality is seen. Status post cholecystectomy. No biliary dilatation. Pancreas: Unremarkable. No pancreatic ductal dilatation or surrounding inflammatory changes. Spleen: Normal in size without focal abnormality. Adrenals/Urinary Tract: Adrenal glands are unremarkable. Kidneys are normal, without renal calculi, focal lesion, or hydronephrosis. Bladder is unremarkable.  Stomach/Bowel: There is sigmoid colon diverticulosis. There is wall thickening of the mid sigmoid colon with surrounding inflammation worrisome for acute diverticulitis. No evidence for perforation or abscess. No bowel obstruction. Appendix is likely surgically absent. There are postsurgical changes in the stomach and proximal small bowel compatible with gastric bypass. No evidence for bowel obstruction. Vascular/Lymphatic: No significant vascular findings are present. No enlarged abdominal or pelvic lymph nodes. Reproductive: Uterus is surgically absent. The left ovary appears prominent in size and there is surrounding inflammation. Right ovary and uterus are not seen. Other: Trace free fluid in the pelvis. No abdominal wall hernia. There are coarse calcifications in the right breast with some scarring. There surgical clips in the right breast. Musculoskeletal: No acute or significant osseous findings. IMPRESSION: 1. Acute sigmoid colon diverticulitis. No evidence for perforation or abscess. 2. The left ovary appears prominent in size and there is surrounding inflammation. Recommend further evaluation with pelvic ultrasound. 3. Trace free fluid in the pelvis. Electronically Signed: By: ARonney AstersM.D. On: 08/31/2022 20:29   [place]PELV[place] COMPLETE WITH TRANSVAGINAL  Result Date: 08/31/2022 CLINICAL DATA:  lower abdominal pain, abnormal CT EXAM: TRANSABDOMINAL AND TRANSVAGINAL ULTRASOUND OF PELVIS TECHNIQUE: Both transabdominal and transvaginal  ultrasound examinations of the pelvis were performed. Transabdominal technique was performed for global imaging of the pelvis including uterus, ovaries, adnexal regions, and pelvic cul-de-sac. It was necessary to proceed with endovaginal exam following the transabdominal exam to visualize the ovaries. COMPARISON:  CT today FINDINGS: Uterus Measurements: Prior hysterectomy Endometrium Thickness: Prior hysterectomy. Right ovary Measurements: Not visualized.  No adnexal mass  seen. Left ovary Measurements: Not visualized.  No adnexal mass seen. Other findings No abnormal free fluid. IMPRESSION: Neither ovary could be visualized.  No adnexal mass seen. Prior hysterectomy. Electronically Signed   By: Rolm Baptise M.D.   On: 08/31/2022 23:16      IMPRESSION AND PLAN:  Assessment and Plan: * Sigmoid diverticulitis - The patient will be admitted to a medical bed. - This may be associated with left oophoritis. - We will continue antibiotic therapy with IV Rocephin and Flagyl. - Pain management will be provided. - We will place on hydration with IV normal saline. - Pain management will be provided. -GYN consult can be obtained with no improvement with IV antibiotics  as above mentioned in HPI.  Essential hypertension - We will continue her antihypertensives.  Dyslipidemia - We will continue statin therapy.  GERD without esophagitis - We will continue PPI therapy.  Depression - We will continue Lexapro.   DVT prophylaxis: Lovenox.  Advanced Care Planning:  Code Status: full code.  Family Communication:  The plan of care was discussed in details with the patient (and family). I answered all questions. The patient agreed to proceed with the above mentioned plan. Further management will depend upon hospital course. Disposition Plan: Back to previous home environment Consults called: none.  All the records are reviewed and case discussed with ED provider.  Status is: Inpatient    At the time of the admission, it appears that the appropriate admission status for this patient is inpatient.  This is judged to be reasonable and necessary in order to provide the required intensity of service to ensure the patient's safety given the presenting symptoms, physical exam findings and initial radiographic and laboratory data in the context of comorbid conditions.  The patient requires inpatient status due to high intensity of service, high risk of further deterioration  and high frequency of surveillance required.  I certify that at the time of admission, it is my clinical judgment that the patient will require inpatient hospital care extending more than 2 midnights.                            Dispo: The patient is from: Home              Anticipated d/c is to: Home              Patient currently is not medically stable to d/c.              Difficult to place patient: No  Christel Mormon M.D on 09/01/2022 at 5:41 AM  [facility]   From 7 PM-7 AM, contact night-coverage [email or web address]  CC: Primary care physician; Rusty Aus, MD

## 2022-09-01 NOTE — Assessment & Plan Note (Addendum)
Patient presented with acute left lower quadrant and suprapubic abdominal pain with CT abdomen/pelvis evidence of acute sigmoid diverticulitis without perforation or abscess. Patient was started on IV fluids in addition to Ceftriaxone and Flagyl for empiric treatment of diverticulitis with improvement of symptoms. Repeat CT abdomen/pelvis obtained secondary to recurrent abdominal discomfort which confirmed improvement of inflammation and disease process. Patient's diet was advanced successfully to a soft diet. Patient transitioned to Ciprofloxacin and Flagyl on discharge to complete a 10 day course of total antibiotics.

## 2022-09-01 NOTE — Progress Notes (Signed)
Brief same day note:  Patient is a 61 year old female with history of diverticulosis, hypertension, hyperlipidemia, GERD who presented with complaint of acute onset of left lower quadrant and suprapubic abdominal pain associated with constipation.  On presentation, she was mildly febrile otherwise hemodynamically stable.  Lab work showed a WBC of 11.8.  CT abdomen/pelvis showed acute sigmoid diverticulitis without perforation or abscess, inflammation of left ovary. Patient has been started on ceftriaxone and Flagyl for diverticulitis.  Currently she is tolerating clear liquid diet. Patient seen and examined at the bedside today.  During my evaluation, she was hemodynamically stable.  Pain is well controlled.  We will continue current management for now.

## 2022-09-02 DIAGNOSIS — K5732 Diverticulitis of large intestine without perforation or abscess without bleeding: Secondary | ICD-10-CM | POA: Diagnosis not present

## 2022-09-02 LAB — CBC
HCT: 31.6 % — ABNORMAL LOW (ref 36.0–46.0)
Hemoglobin: 10.4 g/dL — ABNORMAL LOW (ref 12.0–15.0)
MCH: 31.4 pg (ref 26.0–34.0)
MCHC: 32.9 g/dL (ref 30.0–36.0)
MCV: 95.5 fL (ref 80.0–100.0)
Platelets: 246 10*3/uL (ref 150–400)
RBC: 3.31 MIL/uL — ABNORMAL LOW (ref 3.87–5.11)
RDW: 11.9 % (ref 11.5–15.5)
WBC: 8.9 10*3/uL (ref 4.0–10.5)
nRBC: 0 % (ref 0.0–0.2)

## 2022-09-02 LAB — BASIC METABOLIC PANEL
Anion gap: 9 (ref 5–15)
BUN: 13 mg/dL (ref 8–23)
CO2: 22 mmol/L (ref 22–32)
Calcium: 8 mg/dL — ABNORMAL LOW (ref 8.9–10.3)
Chloride: 104 mmol/L (ref 98–111)
Creatinine, Ser: 0.85 mg/dL (ref 0.44–1.00)
GFR, Estimated: >60 mL/min (ref >60)
Glucose, Bld: 101 mg/dL — ABNORMAL HIGH (ref 70–99)
Potassium: 3.4 mmol/L — ABNORMAL LOW (ref 3.5–5.1)
Sodium: 135 mmol/L (ref 135–145)

## 2022-09-02 MED ORDER — POTASSIUM CHLORIDE CRYS ER 20 MEQ PO TBCR
40.0000 meq | EXTENDED_RELEASE_TABLET | Freq: Once | ORAL | Status: AC
  Administered 2022-09-02: 40 meq via ORAL
  Filled 2022-09-02: qty 2

## 2022-09-02 NOTE — Progress Notes (Signed)
PROGRESS NOTE  Jessica Hammond  KGM:[id number] DOB: 19-Dec-1960 DOA: 08/31/2022 PCP: Rusty Aus, MD   Brief Narrative: Patient is a 61 year old female with history of diverticulosis, hypertension, hyperlipidemia, GERD who presented with complaint of acute onset of left lower quadrant and suprapubic abdominal pain associated with constipation.  On presentation, she was mildly febrile otherwise hemodynamically stable.  Lab work showed a WBC of 11.8.  CT abdomen/pelvis showed acute sigmoid diverticulitis without perforation or abscess, inflammation of left ovary. Patient has been started on ceftriaxone and Flagyl for diverticulitis. Currently she is tolerating clear liquid diet.Plan for advancement to full   Assessment & Plan:  Principal Problem:   Sigmoid diverticulitis Active Problems:   Essential hypertension   Dyslipidemia   GERD without esophagitis   Depression   Acute sigmoid diverticulitis:presented with complaint of acute onset of left lower quadrant and suprapubic abdominal pain associated with constipation.  Lab work showed a WBC of 11.8.  CT abdomen/pelvis showed acute sigmoid diverticulitis without perforation or abscess, inflammation of left ovary. Patient has been started on ceftriaxone and Flagyl for diverticulitis. Currently she is tolerating clear liquid diet.Plan for advancement to full . Pain has not completely subsided yet.  She still feels some discomfort on the left lower quadrant.  She remains afebrile.  Leukocytosis resolved.  Hypokalemia: Supplement with potassium.  Hypertension: Continue monitoring, continue current medication, normotensive  Hyperlipidemia: Continue statin  Depression: Continue Lexapro        DVT prophylaxis:enoxaparin (LOVENOX) injection 40 mg Start: 09/01/22 0800     Code Status: Full Code  Family Communication: None at bedside  Patient status:Inpatient  Patient is from :Home  Anticipated discharge UY:QIHK  Estimated DC  date:1-2 days   Consultants: None  Procedures:None  Antimicrobials:  Anti-infectives (From admission, onward)    Start     Dose/Rate Route Frequency Ordered Stop   09/01/22 0600  cefTRIAXone (ROCEPHIN) 2 g in sodium chloride 0.9 % 100 mL IVPB        2 g 200 mL/hr over 30 Minutes Intravenous Every 24 hours 09/01/22 0112     09/01/22 0115  metroNIDAZOLE (FLAGYL) IVPB 500 mg        500 mg 100 mL/hr over 60 Minutes Intravenous Every 8 hours 09/01/22 0112     08/31/22 2345  piperacillin-tazobactam (ZOSYN) IVPB 2.25 g        2.25 g 100 mL/hr over 30 Minutes Intravenous  Once 08/31/22 2340 09/01/22 0135       Subjective: Patient seen and examined at bedside today.  Hemodynamically stable.  Pain is better than yesterday but has not completely subsided.  She still feels some discomfort in the left lower quadrant.  Having loose stools.  Afebrile, hemodynamically stable. she will let us know if she is able to advance the diet  Objective: Vitals:   09/01/22 1641 09/01/22 1724 09/01/22 1905 09/02/22 0402  BP: 93/60 (!) 94/53 95/63 116/67  Pulse: 66 73 80 77  Resp: '18 18 18 16  '$ Temp: 98.6 F (37 C) 98.5 F (36.9 C) 99.1 F (37.3 C) 99.2 F (37.3 C)  TempSrc: Oral Oral Oral Oral  SpO2: 95% 98% 96% 97%  Weight:      Height:        Intake/Output Summary (Last 24 hours) at 09/02/2022 1108 Last data filed at 09/02/2022 0806 Gross per 24 hour  Intake 1222.11 ml  Output 1 ml  Net 1221.11 ml   Filed Weights   08/31/22 1651  Weight: 75.3 kg  Examination:  General exam: Overall comfortable, not in distress,obese HEENT: PERRL Respiratory system:  no wheezes or crackles  Cardiovascular system: S1 & S2 heard, RRR.  Gastrointestinal system: Abdomen is nondistended, soft .  Bowel sounds heard.  Mild tenderness in the left lower quadrant Central nervous system: Alert and oriented Extremities: No edema, no clubbing ,no cyanosis Skin: No rashes, no ulcers,no icterus     Data  Reviewed: I have personally reviewed following labs and imaging studies  CBC: Recent Labs  Lab 08/31/22 1700 09/01/22 0516 09/02/22 0450  WBC 11.8* 9.8 8.9  HGB 12.4 11.2* 10.4*  HCT 37.3 34.1* 31.6*  MCV 94.4 95.8 95.5  PLT 300 258 712   Basic Metabolic Panel: Recent Labs  Lab 08/31/22 1700 09/01/22 0516 09/02/22 0450  NA 138 137 135  K 3.9 3.5 3.4*  CL 101 101 104  CO2 '27 28 22  '$ GLUCOSE 111* 112* 101*  BUN '14 19 13  '$ CREATININE 0.84 0.96 0.85  CALCIUM 9.0 8.4* 8.0*     No results found for this or any previous visit (from the past 240 hour(s)).   Radiology Studies: CT Abdomen Pelvis W Contrast  Addendum Date: 08/31/2022   ADDENDUM REPORT: 08/31/2022 23:40 ADDENDUM: Additional Findings: Patient is status post hysterectomy. The right ovary appears within normal limits. The left ovary is asymmetrically mildly enlarged and there is surrounding fluid and stranding. Findings may be related to infectious or inflammatory process involving the left ovary. Torsion would also be in the differential in the appropriate clinical setting. Recommend clinical correlation and follow-up. These results were called by telephone at the time of interpretation on 08/31/2022 at 11:39 pm to provider [facility] , who verbally acknowledged these results. Electronically Signed   By: Ronney Asters M.D.   On: 08/31/2022 23:40   Result Date: 08/31/2022 CLINICAL DATA:  Left lower quadrant pain. EXAM: CT ABDOMEN AND PELVIS WITH CONTRAST TECHNIQUE: Multidetector CT imaging of the abdomen and pelvis was performed using the standard protocol following bolus administration of intravenous contrast. RADIATION DOSE REDUCTION: This exam was performed according to the departmental dose-optimization program which includes automated exposure control, adjustment of the mA and/or kV according to patient size and/or use of iterative reconstruction technique. CONTRAST:  152m OMNIPAQUE IOHEXOL 300 MG/ML  SOLN COMPARISON:   None Available. FINDINGS: Lower chest: No acute abnormality. Hepatobiliary: No focal liver abnormality is seen. Status post cholecystectomy. No biliary dilatation. Pancreas: Unremarkable. No pancreatic ductal dilatation or surrounding inflammatory changes. Spleen: Normal in size without focal abnormality. Adrenals/Urinary Tract: Adrenal glands are unremarkable. Kidneys are normal, without renal calculi, focal lesion, or hydronephrosis. Bladder is unremarkable. Stomach/Bowel: There is sigmoid colon diverticulosis. There is wall thickening of the mid sigmoid colon with surrounding inflammation worrisome for acute diverticulitis. No evidence for perforation or abscess. No bowel obstruction. Appendix is likely surgically absent. There are postsurgical changes in the stomach and proximal small bowel compatible with gastric bypass. No evidence for bowel obstruction. Vascular/Lymphatic: No significant vascular findings are present. No enlarged abdominal or pelvic lymph nodes. Reproductive: Uterus is surgically absent. The left ovary appears prominent in size and there is surrounding inflammation. Right ovary and uterus are not seen. Other: Trace free fluid in the pelvis. No abdominal wall hernia. There are coarse calcifications in the right breast with some scarring. There surgical clips in the right breast. Musculoskeletal: No acute or significant osseous findings. IMPRESSION: 1. Acute sigmoid colon diverticulitis. No evidence for perforation or abscess. 2. The left ovary appears prominent  in size and there is surrounding inflammation. Recommend further evaluation with pelvic ultrasound. 3. Trace free fluid in the pelvis. Electronically Signed: By: Ronney Asters M.D. On: 08/31/2022 20:29   US PELVIC COMPLETE WITH TRANSVAGINAL  Result Date: 08/31/2022 CLINICAL DATA:  lower abdominal pain, abnormal CT EXAM: TRANSABDOMINAL AND TRANSVAGINAL ULTRASOUND OF PELVIS TECHNIQUE: Both transabdominal and transvaginal ultrasound  examinations of the pelvis were performed. Transabdominal technique was performed for global imaging of the pelvis including uterus, ovaries, adnexal regions, and pelvic cul-de-sac. It was necessary to proceed with endovaginal exam following the transabdominal exam to visualize the ovaries. COMPARISON:  CT today FINDINGS: Uterus Measurements: Prior hysterectomy Endometrium Thickness: Prior hysterectomy. Right ovary Measurements: Not visualized.  No adnexal mass seen. Left ovary Measurements: Not visualized.  No adnexal mass seen. Other findings No abnormal free fluid. IMPRESSION: Neither ovary could be visualized.  No adnexal mass seen. Prior hysterectomy. Electronically Signed   By: Rolm Baptise M.D.   On: 08/31/2022 23:16    Scheduled Meds:  enoxaparin (LOVENOX) injection  40 mg Subcutaneous Q24H   escitalopram  10 mg Oral Daily   ferrous sulfate  324 mg Oral Q breakfast   lisinopril  10 mg Oral Daily   And   hydrochlorothiazide  12.5 mg Oral Daily   potassium chloride  40 mEq Oral Once   rosuvastatin  10 mg Oral Daily   triamterene-hydrochlorothiazide  1 tablet Oral Daily   Continuous Infusions:  sodium chloride Stopped (09/01/22 2330)   cefTRIAXone (ROCEPHIN)  IV 2 g (09/02/22 0509)   metronidazole 500 mg (09/02/22 0802)     LOS: 1 day   Shelly Coss, MD [facility] P12/11/2021, 11:08 AM

## 2022-09-03 ENCOUNTER — Inpatient Hospital Stay: Payer: BC Managed Care – PPO

## 2022-09-03 DIAGNOSIS — K5732 Diverticulitis of large intestine without perforation or abscess without bleeding: Secondary | ICD-10-CM | POA: Diagnosis not present

## 2022-09-03 LAB — BASIC METABOLIC PANEL
Anion gap: 6 (ref 5–15)
BUN: 10 mg/dL (ref 8–23)
CO2: 24 mmol/L (ref 22–32)
Calcium: 8.6 mg/dL — ABNORMAL LOW (ref 8.9–10.3)
Chloride: 111 mmol/L (ref 98–111)
Creatinine, Ser: 0.89 mg/dL (ref 0.44–1.00)
GFR, Estimated: >60 mL/min (ref >60)
Glucose, Bld: 111 mg/dL — ABNORMAL HIGH (ref 70–99)
Potassium: 3.9 mmol/L (ref 3.5–5.1)
Sodium: 141 mmol/L (ref 135–145)

## 2022-09-03 LAB — CBC
HCT: 32.3 % — ABNORMAL LOW (ref 36.0–46.0)
Hemoglobin: 10.7 g/dL — ABNORMAL LOW (ref 12.0–15.0)
MCH: 31.4 pg (ref 26.0–34.0)
MCHC: 33.1 g/dL (ref 30.0–36.0)
MCV: 94.7 fL (ref 80.0–100.0)
Platelets: 292 10*3/uL (ref 150–400)
RBC: 3.41 MIL/uL — ABNORMAL LOW (ref 3.87–5.11)
RDW: 11.9 % (ref 11.5–15.5)
WBC: 7 10*3/uL (ref 4.0–10.5)
nRBC: 0 % (ref 0.0–0.2)

## 2022-09-03 MED ORDER — IOHEXOL 300 MG/ML  SOLN
100.0000 mL | Freq: Once | INTRAMUSCULAR | Status: AC | PRN
  Administered 2022-09-03: 100 mL via INTRAVENOUS

## 2022-09-03 MED ORDER — IOHEXOL 9 MG/ML PO SOLN
500.0000 mL | Freq: Once | ORAL | Status: AC | PRN
  Administered 2022-09-03 (×2): 500 mL via ORAL

## 2022-09-03 NOTE — Plan of Care (Signed)
  Problem: Nutrition: Goal: Adequate nutrition will be maintained Outcome: Progressing   Problem: Elimination: Goal: Will not experience complications related to bowel motility Outcome: Progressing   

## 2022-09-03 NOTE — Progress Notes (Signed)
PROGRESS NOTE  Jessica Hammond  QJJ:[id number] DOB: 1961-02-16 DOA: 08/31/2022 PCP: Rusty Aus, MD   Brief Narrative: Patient is a 61 year old female with history of diverticulosis, hypertension, hyperlipidemia, GERD who presented with complaint of acute onset of left lower quadrant and suprapubic abdominal pain associated with constipation.  On presentation, she was mildly febrile otherwise hemodynamically stable.  Lab work showed a WBC of 11.8.  CT abdomen/pelvis showed acute sigmoid diverticulitis without perforation or abscess, inflammation of left ovary. Patient has been started on ceftriaxone and Flagyl for diverticulitis. Currently she on full liquid.  Plan for follow-up CT abdomen/pelvis  Assessment & Plan:  Principal Problem:   Sigmoid diverticulitis Active Problems:   Essential hypertension   Dyslipidemia   GERD without esophagitis   Depression   Acute sigmoid diverticulitis:presented with complaint of acute onset of left lower quadrant and suprapubic abdominal pain associated with constipation.  Lab work showed a WBC of 11.8.  CT abdomen/pelvis showed acute sigmoid diverticulitis without perforation or abscess, inflammation of left ovary. Patient has been started on ceftriaxone and Flagyl for diverticulitis. Diet  advancement to full . Pain has not completely subsided yet.  She still feels some discomfort on the across both lower quadrants.  She remains afebrile.  Leukocytosis resolved. Checking CT abdomen/pelvis today  Hypokalemia: Supplemented with potassium.  Continue monitoring  Hypertension: Continue monitoring, continue current medication, normotensive  Hyperlipidemia: Continue statin  Depression: Continue Lexapro        DVT prophylaxis:enoxaparin (LOVENOX) injection 40 mg Start: 09/01/22 0800     Code Status: Full Code  Family Communication: None at bedside  Patient status:Inpatient  Patient is from :Home  Anticipated discharge GY:JEHU  Estimated  DC date:1-2 days   Consultants: None  Procedures:None  Antimicrobials:  Anti-infectives (From admission, onward)    Start     Dose/Rate Route Frequency Ordered Stop   09/01/22 0600  cefTRIAXone (ROCEPHIN) 2 g in sodium chloride 0.9 % 100 mL IVPB        2 g 200 mL/hr over 30 Minutes Intravenous Every 24 hours 09/01/22 0112     09/01/22 0115  metroNIDAZOLE (FLAGYL) IVPB 500 mg        500 mg 100 mL/hr over 60 Minutes Intravenous Every 8 hours 09/01/22 0112     08/31/22 2345  piperacillin-tazobactam (ZOSYN) IVPB 2.25 g        2.25 g 100 mL/hr over 30 Minutes Intravenous  Once 08/31/22 2340 09/01/22 0135       Subjective: Patient seen and examined at bedside today.  Hemodynamically stable.  She is tolerating clear liquid and started having full liquid but still having soreness on her lower belly.  No nausea or vomiting.  Having loose stools  Objective: Vitals:   09/02/22 0402 09/02/22 2044 09/03/22 0552 09/03/22 0831  BP: 116/67 106/74 138/80 120/71  Pulse: 77 62 70 64  Resp: '16 18 15 17  '$ Temp: 99.2 F (37.3 C) 98.3 F (36.8 C) 97.9 F (36.6 C) 98 F (36.7 C)  TempSrc: Oral Oral Oral Oral  SpO2: 97% 96% 96% 99%  Weight:      Height:        Intake/Output Summary (Last 24 hours) at 09/03/2022 1234 Last data filed at 09/03/2022 0602 Gross per 24 hour  Intake 2325.23 ml  Output --  Net 2325.23 ml   Filed Weights   08/31/22 1651  Weight: 75.3 kg    Examination:  General exam: Overall comfortable, not in distress HEENT: PERRL Respiratory system:  no wheezes or crackles  Cardiovascular system: S1 & S2 heard, RRR.  Gastrointestinal system: Abdomen is nondistended, soft .  Bowel sounds present.  Mild generalized abdominal tenderness Central nervous system: Alert and oriented Extremities: No edema, no clubbing ,no cyanosis Skin: No rashes, no ulcers,no icterus      Data Reviewed: I have personally reviewed following labs and imaging studies  CBC: Recent Labs   Lab 08/31/22 1700 09/01/22 0516 09/02/22 0450 09/03/22 0345  WBC 11.8* 9.8 8.9 7.0  HGB 12.4 11.2* 10.4* 10.7*  HCT 37.3 34.1* 31.6* 32.3*  MCV 94.4 95.8 95.5 94.7  PLT 300 258 246 093   Basic Metabolic Panel: Recent Labs  Lab 08/31/22 1700 09/01/22 0516 09/02/22 0450 09/03/22 0345  NA 138 137 135 141  K 3.9 3.5 3.4* 3.9  CL 101 101 104 111  CO2 '27 28 22 24  '$ GLUCOSE 111* 112* 101* 111*  BUN '14 19 13 10  '$ CREATININE 0.84 0.96 0.85 0.89  CALCIUM 9.0 8.4* 8.0* 8.6*     No results found for this or any previous visit (from the past 240 hour(s)).   Radiology Studies: No results found.  Scheduled Meds:  enoxaparin (LOVENOX) injection  40 mg Subcutaneous Q24H   escitalopram  10 mg Oral Daily   ferrous sulfate  324 mg Oral Q breakfast   lisinopril  10 mg Oral Daily   And   hydrochlorothiazide  12.5 mg Oral Daily   rosuvastatin  10 mg Oral Daily   Continuous Infusions:  sodium chloride 100 mL/hr at 09/03/22 1027   cefTRIAXone (ROCEPHIN)  IV 2 g (09/03/22 0617)   metronidazole 500 mg (09/03/22 1043)     LOS: 2 days   Shelly Coss, MD [facility] P12/12/2021, 12:34 PM

## 2022-09-04 DIAGNOSIS — K5732 Diverticulitis of large intestine without perforation or abscess without bleeding: Secondary | ICD-10-CM | POA: Diagnosis not present

## 2022-09-04 LAB — BASIC METABOLIC PANEL
Anion gap: 5 (ref 5–15)
BUN: 8 mg/dL (ref 8–23)
CO2: 24 mmol/L (ref 22–32)
Calcium: 8.4 mg/dL — ABNORMAL LOW (ref 8.9–10.3)
Chloride: 113 mmol/L — ABNORMAL HIGH (ref 98–111)
Creatinine, Ser: 0.78 mg/dL (ref 0.44–1.00)
GFR, Estimated: >60 mL/min (ref >60)
Glucose, Bld: 89 mg/dL (ref 70–99)
Potassium: 3.8 mmol/L (ref 3.5–5.1)
Sodium: 142 mmol/L (ref 135–145)

## 2022-09-04 LAB — CBC
HCT: 32.6 % — ABNORMAL LOW (ref 36.0–46.0)
Hemoglobin: 10.5 g/dL — ABNORMAL LOW (ref 12.0–15.0)
MCH: 31.3 pg (ref 26.0–34.0)
MCHC: 32.2 g/dL (ref 30.0–36.0)
MCV: 97.3 fL (ref 80.0–100.0)
Platelets: 293 10*3/uL (ref 150–400)
RBC: 3.35 MIL/uL — ABNORMAL LOW (ref 3.87–5.11)
RDW: 11.9 % (ref 11.5–15.5)
WBC: 6 10*3/uL (ref 4.0–10.5)
nRBC: 0 % (ref 0.0–0.2)

## 2022-09-04 NOTE — Plan of Care (Signed)
  Problem: Nutrition: Goal: Adequate nutrition will be maintained Outcome: Completed/Met   Problem: Elimination: Goal: Will not experience complications related to bowel motility Outcome: Completed/Met   Problem: Pain Managment: Goal: General experience of comfort will improve Outcome: Completed/Met

## 2022-09-04 NOTE — Progress Notes (Signed)
PROGRESS NOTE  Jessica Hammond  CBJ:[id number] DOB: [birth year] DOA: 08/31/2022 PCP: Rusty Aus, MD   Brief Narrative: Patient is a 61 year old female with history of diverticulosis, hypertension, hyperlipidemia, GERD who presented with complaint of acute onset of left lower quadrant and suprapubic abdominal pain associated with constipation.  On presentation, she was mildly febrile otherwise hemodynamically stable.  Lab work showed a WBC of 11.8.  CT abdomen/pelvis showed acute sigmoid diverticulitis without perforation or abscess, inflammation of left ovary. Patient has been started on ceftriaxone and Flagyl for diverticulitis.  Diet advanced to soft today.  Potential discharge home tomorrow if she feels better.   Assessment & Plan:  Principal Problem:   Sigmoid diverticulitis Active Problems:   Essential hypertension   Dyslipidemia   GERD without esophagitis   Depression   Acute sigmoid diverticulitis:presented with complaint of acute onset of left lower quadrant and suprapubic abdominal pain associated with constipation.  Lab work showed a WBC of 11.8.  CT abdomen/pelvis showed acute sigmoid diverticulitis without perforation or abscess, inflammation of left ovary. Patient has been started on ceftriaxone and Flagyl for diverticulitis.  She remains afebrile.  Leukocytosis resolved. She was still having some discomfort so CT abdomen/pelvis was ordered for follow-up on 12/4 which showed improvement in the inflammation.  .  Diet advanced to soft. Patient needs to follow-up with GI as an outpatient in 6 to 8 weeks to have a colonoscopy  Left ovarian inflammation: This is most likely associated with inflammation of the colon due to diverticulitis.  Follow-up pelvic ultrasound can be done as an outpatient in 4 to 6 weeks  Hypertension: Continue monitoring, continue current medication, normotensive  Hyperlipidemia: Continue statin  Depression: Continue Lexapro        DVT  prophylaxis:enoxaparin (LOVENOX) injection 40 mg Start: 09/01/22 0800     Code Status: Full Code  Family Communication: Daughter at bedside  Patient status:Inpatient  Patient is from :Home  Anticipated discharge HY:WVPX  Estimated DC date: tomorrow   Consultants: General surgery  Procedures:None  Antimicrobials:  Anti-infectives (From admission, onward)    Start     Dose/Rate Route Frequency Ordered Stop   09/01/22 0600  cefTRIAXone (ROCEPHIN) 2 g in sodium chloride 0.9 % 100 mL IVPB        2 g 200 mL/hr over 30 Minutes Intravenous Every 24 hours 09/01/22 0112     09/01/22 0115  metroNIDAZOLE (FLAGYL) IVPB 500 mg        500 mg 100 mL/hr over 60 Minutes Intravenous Every 8 hours 09/01/22 0112     08/31/22 2345  piperacillin-tazobactam (ZOSYN) IVPB 2.25 g        2.25 g 100 mL/hr over 30 Minutes Intravenous  Once 08/31/22 2340 09/01/22 0135       Subjective: Patient seen and examined at bedside today.  Hemodynamically stable.  Still has some discomfort on the left lower quadrant but improving.  Eager to advance the diet to soft.  Not vomiting or nausea.  Having some loose stools   Objective: Vitals:   09/03/22 1657 09/03/22 2201 09/04/22 0406 09/04/22 0747  BP: 126/70 119/71 132/73 (!) 147/93  Pulse: 60 (!) 58 (!) 58 75  Resp: '18 16 15 18  '$ Temp: 98 F (36.7 C) 98.1 F (36.7 C) 97.9 F (36.6 C) 98.2 F (36.8 C)  TempSrc: Oral Oral Oral   SpO2: 98% 96% 97% 98%  Weight:      Height:        Intake/Output Summary (Last  24 hours) at 09/04/2022 1429 Last data filed at 09/03/2022 1620 Gross per 24 hour  Intake 957.28 ml  Output --  Net 957.28 ml   Filed Weights   08/31/22 1651  Weight: 75.3 kg    Examination:  General exam: Overall comfortable, not in distress HEENT: PERRL Respiratory system:  no wheezes or crackles  Cardiovascular system: S1 & S2 heard, RRR.  Gastrointestinal system: Abdomen is nondistended, soft and mild tenderness on the left lower  quadrant Central nervous system: Alert and oriented Extremities: No edema, no clubbing ,no cyanosis Skin: No rashes, no ulcers,no icterus     Data Reviewed: I have personally reviewed following labs and imaging studies  CBC: Recent Labs  Lab 08/31/22 1700 09/01/22 0516 09/02/22 0450 09/03/22 0345 09/04/22 0418  WBC 11.8* 9.8 8.9 7.0 6.0  HGB 12.4 11.2* 10.4* 10.7* 10.5*  HCT 37.3 34.1* 31.6* 32.3* 32.6*  MCV 94.4 95.8 95.5 94.7 97.3  PLT 300 258 246 292 419   Basic Metabolic Panel: Recent Labs  Lab 08/31/22 1700 09/01/22 0516 09/02/22 0450 09/03/22 0345 09/04/22 0418  NA 138 137 135 141 142  K 3.9 3.5 3.4* 3.9 3.8  CL 101 101 104 111 113*  CO2 '27 28 22 24 24  '$ GLUCOSE 111* 112* 101* 111* 89  BUN '14 19 13 10 8  '$ CREATININE 0.84 0.96 0.85 0.89 0.78  CALCIUM 9.0 8.4* 8.0* 8.6* 8.4*     No results found for this or any previous visit (from the past 240 hour(s)).   Radiology Studies: CT ABDOMEN PELVIS W CONTRAST  Result Date: 09/03/2022 CLINICAL DATA:  Diverticulitis.  Complication suspected. EXAM: CT ABDOMEN AND PELVIS WITH CONTRAST TECHNIQUE: Multidetector CT imaging of the abdomen and pelvis was performed using the standard protocol following bolus administration of intravenous contrast. RADIATION DOSE REDUCTION: This exam was performed according to the departmental dose-optimization program which includes automated exposure control, adjustment of the mA and/or kV according to patient size and/or use of iterative reconstruction technique. CONTRAST:  126m OMNIPAQUE IOHEXOL 300 MG/ML  SOLN COMPARISON:  08/31/2022 CT and pelvic ultrasound FINDINGS: Lower chest: No acute abnormality. Hepatobiliary: Prior cholecystectomy. Focal fatty infiltration adjacent to falciform ligament. Pancreas: Unremarkable. No pancreatic ductal dilatation or surrounding inflammatory changes. Spleen: Normal in size without focal abnormality. Adrenals/Urinary Tract: Normal appearance of the adrenal  glands. Kidneys are unremarkable. There is no evidence for ureteral obstruction. Both of the ureters traverse retroperitoneal fluid/inflammation at the level of the pelvic brim and are otherwise unremarkable. The bladder and visualized portion of the urethra are normal. Stomach/Bowel: Prior gastric bypass surgery. Proximal small bowel loops are unremarkable. There is persistent but slightly improved thickening of the wall of the proximal sigmoid colon, associated with numerous diverticula. No evidence for perforation or abscess. There are retroperitoneal inflammatory changes primarily along the LEFT pelvic sidewall. Vascular/Lymphatic: No significant vascular findings are present. No enlarged abdominal or pelvic lymph nodes. Reproductive: Uterus is absent. There are inflammatory changes surrounding the LEFT ovary. There are inflammatory changes in the RIGHT adnexal region. Normal RIGHT ovary is not seen. Other: Similar small amount of ascites. There are postoperative changes in the LATERAL portion of the RIGHT breast. There is gas within the subcutaneous fat of the LEFT LATERAL abdominal wall. Musculoskeletal: No acute or significant osseous findings. IMPRESSION: 1. Persistent but slightly improved thickening of the wall of the proximal sigmoid colon, associated with numerous diverticula. Findings are consistent with acute diverticulitis. 2. No evidence for perforation or abscess. 3. Both ureters  traverse inflammatory changes in the pelvis but are not obstruct. 4. Retroperitoneal inflammatory changes in the pelvis are similar to prior study. 5. Gas within the subcutaneous fat of the LEFT LATERAL abdominal wall, of uncertain significance. Has the patient had recent injections or surgery of the LEFT LATERAL abdominal wall? 6. Prior cholecystectomy, gastric bypass surgery, and hysterectomy. These results will be called to the ordering clinician or representative by the Radiologist Assistant, and communication  documented in the PACS or [employer]. Electronically Signed   By: Nolon Nations M.D.   On: 09/03/2022 16:20    Scheduled Meds:  enoxaparin (LOVENOX) injection  40 mg Subcutaneous Q24H   escitalopram  10 mg Oral Daily   ferrous sulfate  324 mg Oral Q breakfast   lisinopril  10 mg Oral Daily   And   hydrochlorothiazide  12.5 mg Oral Daily   rosuvastatin  10 mg Oral Daily   Continuous Infusions:  sodium chloride 100 mL/hr at 09/04/22 0007   cefTRIAXone (ROCEPHIN)  IV 2 g (09/04/22 0453)   metronidazole 500 mg (09/04/22 0809)     LOS: 3 days   Shelly Coss, MD [facility] P12/01/2022, 2:29 PM

## 2022-09-04 NOTE — TOC Initial Note (Signed)
Transition of Care [facility]) - Initial/Assessment Note    Patient Details  Name: Jessica Hammond MRN: [id number] Date of Birth: 07/16/1961  Transition of Care [facility]) CM/SW Contact:    Laurena Slimmer, RN Phone Number: 09/04/2022, 3:07 PM  Clinical Narrative:                  Transition of Care [facility]) Screening Note   Patient Details  Name: Jessica Hammond Date of Birth: Feb 04, 1961   Transition of Care [facility]) CM/SW Contact:    Laurena Slimmer, RN Phone Number: 09/04/2022, 3:07 PM    Transition of Care Department [facility]) has reviewed patient and no TOC needs have been identified at this time. We will continue to monitor patient advancement through interdisciplinary progression rounds. If new patient transition needs arise, please place a TOC consult.          Patient Goals and CMS Choice        Expected Discharge Plan and Services                                                Prior Living Arrangements/Services                       Activities of Daily Living Home Assistive Devices/Equipment: None ADL Screening (condition at time of admission) Patient's cognitive ability adequate to safely complete daily activities?: Yes Is the patient deaf or have difficulty hearing?: No Does the patient have difficulty seeing, even when wearing glasses/contacts?: No Does the patient have difficulty concentrating, remembering, or making decisions?: No Patient able to express need for assistance with ADLs?: Yes Does the patient have difficulty dressing or bathing?: No Independently performs ADLs?: Yes (appropriate for developmental age) Does the patient have difficulty walking or climbing stairs?: No Weakness of Legs: None Weakness of Arms/Hands: None  Permission Sought/Granted                  Emotional Assessment              Admission diagnosis:  Diverticulitis [K57.92] Sigmoid diverticulitis [K57.32] Patient Active Problem List   Diagnosis Date  Noted   Sigmoid diverticulitis 09/01/2022   Essential hypertension 09/01/2022   GERD without esophagitis 09/01/2022   Dyslipidemia 09/01/2022   Depression 09/01/2022   Morbid obesity ([place]) 03/28/2022   Elevated LFTs 07/03/2020   B12 deficiency 08/20/2017   Primary cancer of right breast ([place]) 11/16/2016   Iron deficiency anemia following bariatric surgery 11/16/2016   H/O gastric bypass 08/16/2016   Benign neoplasm of cecum    Iron deficiency anemia secondary to inadequate dietary iron intake 07/27/2016   Hyperlipidemia    Hypertension    GERD (gastroesophageal reflux disease)    Benign essential HTN 11/03/2013   Acid reflux 11/03/2013   Combined fat and carbohydrate induced hyperlipemia 11/03/2013   H/O malignant neoplasm of breast 11/03/2013   PCP:  Rusty Aus, MD Pharmacy:   [id number] [place] [id number]: [phone]: [phone]    Social Determinants of Health (SDOH) Interventions    Readmission Risk Interventions     No data to display

## 2022-09-04 NOTE — Progress Notes (Signed)
Patient ID: Jessica Hammond, female   DOB: 1960-11-03, 61 y.o.   MRN: [id number]   SURGICAL CONSULTATION NOTE   HISTORY OF PRESENT ILLNESS (HPI):  61 y.o. female presented to [facility] ED 08/31/2022 for evaluation of abdominal pain. Patient reports she has been having on and off pain for at least a week but it got it very intense today before coming to the ED.  Pain localized to the lower abdomen on both right and left lower quadrant.  Pain does not radiate to other part of body.  Pain was aggravated by applying pressure.  No alleviating factors.  In the ED she was found with tenderness to palpation in the lower abdomen.  She also had mild leukocytosis.  CT scan of the abdomen and pelvis showed diverticulitis with inflammation consistent with noncomplicated diverticulitis of the sigmoid colon.  She was admitted for treatment with IV antibiotic.  Since admission the patient decided the pain has been improving.  She had a repeated CT scan that shows improvement of the inflammatory process.  I patient evaluated the images of the admission CT scan and the CT scan done on 09/03/2022.  Today on my evaluation patient endorses significant improvement of the abdominal pain.  There is just mild soreness on the lower abdomen.  She understood she tolerated grits for the full liquids yesterday.  Surgery is consulted by Dr. Tamsen Meek in this context for evaluation and management of diverticulitis.  PAST MEDICAL HISTORY (PMH):  Past Medical History:  Diagnosis Date   Cancer ([place]) 09/22/2008   rt breast   GERD (gastroesophageal reflux disease)    Hyperlipidemia    Hypertension    Pterygium eye, right    Seasonal allergies      PAST SURGICAL HISTORY ([place]):  Past Surgical History:  Procedure Laterality Date   ABDOMINAL HYSTERECTOMY     still has cervix   AUGMENTATION MAMMAPLASTY  02/03/2009   tranflap   BREAST EXCISIONAL BIOPSY Right 09/22/2008   lumpectomy +   CHOLECYSTECTOMY     COLONOSCOPY  2013   COLONOSCOPY  WITH PROPOFOL N/A 08/03/2016   Procedure: COLONOSCOPY WITH PROPOFOL;  Surgeon: [name], MD;  Location: [facility] ENDOSCOPY;  Service: Endoscopy;  Laterality: N/A;   ESOPHAGOGASTRODUODENOSCOPY (EGD) WITH PROPOFOL N/A 08/03/2016   Procedure: ESOPHAGOGASTRODUODENOSCOPY (EGD) WITH PROPOFOL;  Surgeon: [name], MD;  Location: [facility] ENDOSCOPY;  Service: Endoscopy;  Laterality: N/A;   GIVENS CAPSULE STUDY N/A 08/14/2016   Procedure: GIVENS CAPSULE STUDY;  Surgeon: [name], MD;  Location: [facility] ENDOSCOPY;  Service: Gastroenterology;  Laterality: N/A;   INGUINAL HERNIA REPAIR Right    LAPAROSCOPIC CHOLECYSTECTOMY W/ CHOLANGIOGRAPHY  1999   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    sleep apnea surgery  2003     MEDICATIONS:  Prior to Admission medications   Medication Sig Start Date End Date Taking? Authorizing Provider  BIOTIN PO Take by mouth.   Yes [provider]  Calcium Carbonate-Vitamin D (CALCIUM-VITAMIN D3 PO) Take 1 tablet by mouth daily at 12 noon. 600-400   Yes [provider]  Cholecalciferol (VITAMIN D3) 25 MCG (1000 UT) CAPS Vitamin D3 25 mcg (1,000 unit) capsule  Take 1 capsule every day by oral route.   Yes [provider]  Cyanocobalamin (VITAMIN B-12) 2500 MCG SUBL Place 1 tablet under the tongue daily at 12 noon.   Yes [provider]  escitalopram (  LEXAPRO) 10 MG tablet Take 10 mg by mouth daily. 10/14/19  Yes [provider]  lisinopril-hydrochlorothiazide (PRINZIDE,ZESTORETIC) 10-12.5 MG tablet Take by mouth daily.  11/27/16 09/01/22 Yes [provider]  Multiple Vitamin (MULTI-VITAMINS) TABS Take 1 tablet by mouth daily at 12 noon.   Yes [provider]  Omega-3 Fatty Acids (FISH OIL) 1000 MG CAPS Take 1 capsule by mouth daily at 12 noon.   Yes [provider]  rosuvastatin (CRESTOR) 10 MG  tablet Take 10 mg by mouth daily. 11/21/19  Yes [provider]  VITAMIN E PO Take 400 Units by mouth daily at 12 noon.   Yes [provider]  ferrous sulfate 324 MG TBEC Take 324 mg by mouth daily with breakfast. Patient not taking: Reported on 09/01/2022    [provider]     ALLERGIES:  No Known Allergies   SOCIAL HISTORY:  Social History   Socioeconomic History   Marital status: Married    Spouse name: Not on file   Number of children: Not on file   Years of education: Not on file   Highest education level: Not on file  Occupational History   Not on file  Tobacco Use   Smoking status: Never   Smokeless tobacco: Never  Substance and Sexual Activity   Alcohol use: Yes    Alcohol/week: 0.0 standard drinks of alcohol    Comment: occasional consumption   Drug use: No   Sexual activity: Not on file  Other Topics Concern   Not on file  Social History Narrative   Not on file   Social Determinants of Health   Financial Resource Strain: Not on file  Food Insecurity: Not on file  Transportation Needs: Not on file  Physical Activity: Not on file  Stress: Not on file  Social Connections: Not on file  Intimate Partner Violence: Not on file      FAMILY HISTORY:  Family History  Problem Relation Age of Onset   Breast cancer Mother 57   Breast cancer Maternal Aunt 12       twin sister to mother     REVIEW OF SYSTEMS:  Constitutional: denies weight loss, fever, chills, or sweats  Eyes: denies any other vision changes, history of eye injury  ENT: denies sore throat, hearing problems  Respiratory: denies shortness of breath, wheezing  Cardiovascular: denies chest pain, palpitations  Gastrointestinal: Positive abdominal pain, nausea and vomiting Genitourinary: denies burning with urination or urinary frequency Musculoskeletal: denies any other joint pains or cramps  Skin: denies any other rashes or skin discolorations  Neurological: denies any  other headache, dizziness, weakness  Psychiatric: denies any other depression, anxiety   All other review of systems were negative   VITAL SIGNS:  Temp:  [97.9 F (36.6 C)-98.2 F (36.8 C)] 98.2 F (36.8 C) (12/05 0747) Pulse Rate:  [58-75] 75 (12/05 0747) Resp:  [15-18] 18 (12/05 0747) BP: (119-147)/(70-93) 147/93 (12/05 0747) SpO2:  [96 %-99 %] 98 % (12/05 0747)     Height: '4\' 10"'$  (147.3 cm) Weight: 75.3 kg BMI (Calculated): 34.7   INTAKE/OUTPUT:  This shift: No intake/output data recorded.  Last 2 shifts: '@IOLAST2SHIFTS'$ @   PHYSICAL EXAM:  Constitutional:  -- Normal body habitus  -- Awake, alert, and oriented x3  Eyes:  -- Pupils equally round and reactive to light  -- No scleral icterus  Ear, nose, and throat:  -- No jugular venous distension  Pulmonary:  -- No crackles  -- Equal breath  sounds bilaterally -- Breathing non-labored at rest Cardiovascular:  -- S1, S2 present  -- No pericardial rubs Gastrointestinal:  -- Abdomen soft, mild soreness on the lower abdomen, non-distended, no guarding or rebound tenderness -- No abdominal masses appreciated, pulsatile or otherwise  Musculoskeletal and Integumentary:  -- Wounds: None appreciated -- Extremities: B/L UE and LE FROM, hands and feet warm, no edema  Neurologic:  -- Motor function: intact and symmetric -- Sensation: intact and symmetric   Labs:     Latest Ref Rng & Units 09/04/2022    4:18 AM 09/03/2022    3:45 AM 09/02/2022    4:50 AM  CBC  WBC 4.0 - 10.5 K/uL 6.0  7.0  8.9   Hemoglobin 12.0 - 15.0 g/dL 10.5  10.7  10.4   Hematocrit 36.0 - 46.0 % 32.6  32.3  31.6   Platelets 150 - 400 K/uL 293  292  246       Latest Ref Rng & Units 09/04/2022    4:18 AM 09/03/2022    3:45 AM 09/02/2022    4:50 AM  CMP  Glucose 70 - 99 mg/dL 89  111  101   BUN 8 - 23 mg/dL '8  10  13   '$ Creatinine 0.44 - 1.00 mg/dL 0.78  0.89  0.85   Sodium 135 - 145 mmol/L 142  141  135   Potassium 3.5 - 5.1 mmol/L 3.8  3.9  3.4    Chloride 98 - 111 mmol/L 113  111  104   CO2 22 - 32 mmol/L '24  24  22   '$ Calcium 8.9 - 10.3 mg/dL 8.4  8.6  8.0      Imaging studies:  EXAM: CT ABDOMEN AND PELVIS WITH CONTRAST   TECHNIQUE: Multidetector CT imaging of the abdomen and pelvis was performed using the standard protocol following bolus administration of intravenous contrast.   RADIATION DOSE REDUCTION: This exam was performed according to the departmental dose-optimization program which includes automated exposure control, adjustment of the mA and/or kV according to patient size and/or use of iterative reconstruction technique.   CONTRAST:  143m OMNIPAQUE IOHEXOL 300 MG/ML  SOLN   COMPARISON:  08/31/2022 CT and pelvic ultrasound   FINDINGS: Lower chest: No acute abnormality.   Hepatobiliary: Prior cholecystectomy. Focal fatty infiltration adjacent to falciform ligament.   Pancreas: Unremarkable. No pancreatic ductal dilatation or surrounding inflammatory changes.   Spleen: Normal in size without focal abnormality.   Adrenals/Urinary Tract: Normal appearance of the adrenal glands. Kidneys are unremarkable. There is no evidence for ureteral obstruction. Both of the ureters traverse retroperitoneal fluid/inflammation at the level of the pelvic brim and are otherwise unremarkable. The bladder and visualized portion of the urethra are normal.   Stomach/Bowel: Prior gastric bypass surgery. Proximal small bowel loops are unremarkable. There is persistent but slightly improved thickening of the wall of the proximal sigmoid colon, associated with numerous diverticula. No evidence for perforation or abscess. There are retroperitoneal inflammatory changes primarily along the LEFT pelvic sidewall.   Vascular/Lymphatic: No significant vascular findings are present. No enlarged abdominal or pelvic lymph nodes.   Reproductive: Uterus is absent. There are inflammatory changes surrounding the LEFT ovary. There are  inflammatory changes in the RIGHT adnexal region. Normal RIGHT ovary is not seen.   Other: Similar small amount of ascites. There are postoperative changes in the LATERAL portion of the RIGHT breast. There is gas within the subcutaneous fat of the LEFT LATERAL abdominal wall.   Musculoskeletal: No  acute or significant osseous findings.   IMPRESSION: 1. Persistent but slightly improved thickening of the wall of the proximal sigmoid colon, associated with numerous diverticula. Findings are consistent with acute diverticulitis. 2. No evidence for perforation or abscess. 3. Both ureters traverse inflammatory changes in the pelvis but are not obstruct. 4. Retroperitoneal inflammatory changes in the pelvis are similar to prior study. 5. Gas within the subcutaneous fat of the LEFT LATERAL abdominal wall, of uncertain significance. Has the patient had recent injections or surgery of the LEFT LATERAL abdominal wall? 6. Prior cholecystectomy, gastric bypass surgery, and hysterectomy.   These results will be called to the ordering clinician or representative by the Radiologist Assistant, and communication documented in the PACS or [employer].     Electronically Signed   By: Nolon Nations M.D.   On: 09/03/2022 16:20  Assessment/Plan:  61 y.o. female with acute uncomplicated diverticulitis, complicated by pertinent comorbidities including hypertension, dyslipidemia, GERD.  -Finding on CT scan on admission and repeat a CT scan are consistent with uncomplicated diverticulitis. -Patient has been responding well to IV antibiotic therapy -He has been no fever.  Today physical exam with minimal soreness on lower abdomen -Patient tolerated a full liquid diet -From surgical standpoint, there is no indication for surgery for uncomplicated diverticulitis -Patient can be transition to oral antibiotic therapy and discharged home when medically stable -No surgical follow-up needed.  She  should follow-up with her gastroenterologist for discussion of colonoscopy.  Last colonoscopy on November 2017.  All of the above findings and recommendations were discussed with the patient and all of patient's questions were answered to her expressed satisfaction.  Arnold Long, MD

## 2022-09-05 MED ORDER — CIPROFLOXACIN HCL 500 MG PO TABS: 500.0000 mg | ORAL_TABLET | Freq: Two times a day (BID) | ORAL | 0 refills | Status: AC

## 2022-09-05 MED ORDER — METRONIDAZOLE 500 MG PO TABS: 500.0000 mg | ORAL_TABLET | Freq: Two times a day (BID) | ORAL | 0 refills | Status: AC

## 2022-09-05 NOTE — Discharge Instructions (Addendum)
Jessica Hammond,  You were in the hospital with acute diverticulitis. This has significantly improved with antibiotics. Please continue your antibiotic course on discharge and follow-up with your PCP. Please return if you have worsening abdominal pain, fevers, nausea/vomiting.

## 2022-09-05 NOTE — Plan of Care (Signed)
  Problem: Education: Goal: Knowledge of General Education information will improve Description: Including pain rating scale, medication(s)/side effects and non-pharmacologic comfort measures Outcome: Adequate for Discharge   Problem: Health Behavior/Discharge Planning: Goal: Ability to manage health-related needs will improve Outcome: Adequate for Discharge   Problem: Clinical Measurements: Goal: Ability to maintain clinical measurements within normal limits will improve Outcome: Adequate for Discharge Goal: Will remain free from infection Outcome: Adequate for Discharge Goal: Diagnostic test results will improve Outcome: Adequate for Discharge Goal: Respiratory complications will improve Outcome: Adequate for Discharge Goal: Cardiovascular complication will be avoided Outcome: Adequate for Discharge   Problem: Activity: Goal: Risk for activity intolerance will decrease Outcome: Adequate for Discharge   Problem: Coping: Goal: Level of anxiety will decrease Outcome: Adequate for Discharge   Problem: Elimination: Goal: Will not experience complications related to urinary retention Outcome: Adequate for Discharge   Problem: Safety: Goal: Ability to remain free from injury will improve Outcome: Adequate for Discharge   Problem: Skin Integrity: Goal: Risk for impaired skin integrity will decrease Outcome: Adequate for Discharge

## 2022-09-05 NOTE — Discharge Summary (Signed)
Physician Discharge Summary   Patient: Jessica Hammond MRN: [id number] DOB: 1961/07/03  Admit date:     08/31/2022  Discharge date: 09/05/2022  Discharge Physician: Cordelia Poche, MD   PCP: Rusty Aus, MD   Recommendations at discharge:  PCP follow-up Pelvic ultrasound in 4-6 weeks to assess left ovarian inflammation GI follow-up in 6-8 weeks  Discharge Diagnoses: Principal Problem:   Sigmoid diverticulitis Active Problems:   Essential hypertension   Dyslipidemia   GERD without esophagitis   Depression  Resolved Problems:   * No resolved hospital problems. *  Hospital Course: Jessica Hammond is a 61 y.o. female with a history of hypertension, hyperlipidemia and GERD. The patient was admitted due to acute left lower quadrant and suprapubic abdominal pain, diagnosed with acute sigmoid diverticulitis via CT abdomen/pelvis without perforation or abscess. Treatment involved IV fluids, Ceftriaxone, and Flagyl, resulting in symptom improvement. A follow-up CT scan showed decreased inflammation, allowing successful advancement to a soft diet. Discharge included a transition to Ciprofloxacin and Flagyl to complete a 10-day antibiotic course.  Assessment and Plan: * Sigmoid diverticulitis Patient presented with acute left lower quadrant and suprapubic abdominal pain with CT abdomen/pelvis evidence of acute sigmoid diverticulitis without perforation or abscess. Patient was started on IV fluids in addition to Ceftriaxone and Flagyl for empiric treatment of diverticulitis with improvement of symptoms. Repeat CT abdomen/pelvis obtained secondary to recurrent abdominal discomfort which confirmed improvement of inflammation and disease process. Patient's diet was advanced successfully to a soft diet. Patient transitioned to Ciprofloxacin and Flagyl on discharge to complete a 10 day course of total antibiotics.   Ovary, inflammation Left ovary. Noted on CT imaging and likely related to acute sigmoid  diverticulitis. Recommendation for pelvic ultrasound in 4-6 weeks.  Essential hypertension Continue home regimen.  Dyslipidemia Continue home regimen.  GERD without esophagitis Not on medication management.  Depression Continue Lexapro.    Consultants: None Procedures performed: None  Disposition: Home Diet recommendation: Soft diet  DISCHARGE MEDICATION: Allergies as of 09/05/2022   No Known Allergies      Medication List     TAKE these medications    BIOTIN PO Take by mouth.   CALCIUM-VITAMIN D3 PO Take 1 tablet by mouth daily at 12 noon. 600-400   ciprofloxacin 500 MG tablet Commonly known as: CIPRO Take 1 tablet (500 mg total) by mouth 2 (two) times daily for 5 days.   escitalopram 10 MG tablet Commonly known as: LEXAPRO Take 10 mg by mouth daily.   ferrous sulfate 324 MG Tbec Take 324 mg by mouth daily with breakfast.   Fish Oil 1000 MG Caps Take 1 capsule by mouth daily at 12 noon.   lisinopril-hydrochlorothiazide 10-12.5 MG tablet Commonly known as: ZESTORETIC Take by mouth daily.   metroNIDAZOLE 500 MG tablet Commonly known as: Flagyl Take 1 tablet (500 mg total) by mouth 2 (two) times daily for 5 days.   Multi-Vitamins Tabs Take 1 tablet by mouth daily at 12 noon.   rosuvastatin 10 MG tablet Commonly known as: CRESTOR Take 10 mg by mouth daily.   Vitamin B-12 2500 MCG Subl Place 1 tablet under the tongue daily at 12 noon.   Vitamin D3 25 MCG (1000 UT) Caps Vitamin D3 25 mcg (1,000 unit) capsule  Take 1 capsule every day by oral route.   VITAMIN E PO Take 400 Units by mouth daily at 12 noon.        Follow-up Information     Rusty Aus,  MD Follow up in 1 week(s).   Specialty: Internal Medicine Why: For hospital follow-up Contact information: [address] [place] [id number] [phone]                Discharge Exam: BP (!) 144/87   Pulse (!) 56   Temp 97.9 F  (36.6 C) (Oral)   Resp 18   Ht '4\' 10"'$  (1.473 m)   Wt 75.3 kg   SpO2 98%   BMI 34.69 kg/m   General exam: Appears calm and comfortable Respiratory system: Clear to auscultation. Respiratory effort normal. Cardiovascular system: S1 & S2 heard, RRR. Gastrointestinal system: Abdomen is nondistended, soft and nontender.  Normal bowel sounds heard. Central nervous system: Alert and oriented. No focal neurological deficits. Musculoskeletal:  No calf tenderness Psychiatry: Judgement and insight appear normal. Mood & affect appropriate.   Condition at discharge: stable  The results of significant diagnostics from this hospitalization (including imaging, microbiology, ancillary and laboratory) are listed below for reference.   Imaging Studies: CT ABDOMEN PELVIS W CONTRAST  Result Date: 09/03/2022 CLINICAL DATA:  Diverticulitis.  Complication suspected. EXAM: CT ABDOMEN AND PELVIS WITH CONTRAST TECHNIQUE: Multidetector CT imaging of the abdomen and pelvis was performed using the standard protocol following bolus administration of intravenous contrast. RADIATION DOSE REDUCTION: This exam was performed according to the departmental dose-optimization program which includes automated exposure control, adjustment of the mA and/or kV according to patient size and/or use of iterative reconstruction technique. CONTRAST:  150m OMNIPAQUE IOHEXOL 300 MG/ML  SOLN COMPARISON:  08/31/2022 CT and pelvic ultrasound FINDINGS: Lower chest: No acute abnormality. Hepatobiliary: Prior cholecystectomy. Focal fatty infiltration adjacent to falciform ligament. Pancreas: Unremarkable. No pancreatic ductal dilatation or surrounding inflammatory changes. Spleen: Normal in size without focal abnormality. Adrenals/Urinary Tract: Normal appearance of the adrenal glands. Kidneys are unremarkable. There is no evidence for ureteral obstruction. Both of the ureters traverse retroperitoneal fluid/inflammation at the level of the pelvic  brim and are otherwise unremarkable. The bladder and visualized portion of the urethra are normal. Stomach/Bowel: Prior gastric bypass surgery. Proximal small bowel loops are unremarkable. There is persistent but slightly improved thickening of the wall of the proximal sigmoid colon, associated with numerous diverticula. No evidence for perforation or abscess. There are retroperitoneal inflammatory changes primarily along the LEFT pelvic sidewall. Vascular/Lymphatic: No significant vascular findings are present. No enlarged abdominal or pelvic lymph nodes. Reproductive: Uterus is absent. There are inflammatory changes surrounding the LEFT ovary. There are inflammatory changes in the RIGHT adnexal region. Normal RIGHT ovary is not seen. Other: Similar small amount of ascites. There are postoperative changes in the LATERAL portion of the RIGHT breast. There is gas within the subcutaneous fat of the LEFT LATERAL abdominal wall. Musculoskeletal: No acute or significant osseous findings. IMPRESSION: 1. Persistent but slightly improved thickening of the wall of the proximal sigmoid colon, associated with numerous diverticula. Findings are consistent with acute diverticulitis. 2. No evidence for perforation or abscess. 3. Both ureters traverse inflammatory changes in the pelvis but are not obstruct. 4. Retroperitoneal inflammatory changes in the pelvis are similar to prior study. 5. Gas within the subcutaneous fat of the LEFT LATERAL abdominal wall, of uncertain significance. Has the patient had recent injections or surgery of the LEFT LATERAL abdominal wall? 6. Prior cholecystectomy, gastric bypass surgery, and hysterectomy. These results will be called to the ordering clinician or representative by the Radiologist Assistant, and communication documented in the PACS or [employer] Electronically Signed  By: Nolon Nations M.D.   On: 09/03/2022 16:20   CT Abdomen Pelvis W Contrast  Addendum Date: 08/31/2022    ADDENDUM REPORT: 08/31/2022 23:40 ADDENDUM: Additional Findings: Patient is status post hysterectomy. The right ovary appears within normal limits. The left ovary is asymmetrically mildly enlarged and there is surrounding fluid and stranding. Findings may be related to infectious or inflammatory process involving the left ovary. Torsion would also be in the differential in the appropriate clinical setting. Recommend clinical correlation and follow-up. These results were called by telephone at the time of interpretation on 08/31/2022 at 11:39 pm to provider [facility] , who verbally acknowledged these results. Electronically Signed   By: Ronney Asters M.D.   On: 08/31/2022 23:40   Result Date: 08/31/2022 CLINICAL DATA:  Left lower quadrant pain. EXAM: CT ABDOMEN AND PELVIS WITH CONTRAST TECHNIQUE: Multidetector CT imaging of the abdomen and pelvis was performed using the standard protocol following bolus administration of intravenous contrast. RADIATION DOSE REDUCTION: This exam was performed according to the departmental dose-optimization program which includes automated exposure control, adjustment of the mA and/or kV according to patient size and/or use of iterative reconstruction technique. CONTRAST:  164m OMNIPAQUE IOHEXOL 300 MG/ML  SOLN COMPARISON:  None Available. FINDINGS: Lower chest: No acute abnormality. Hepatobiliary: No focal liver abnormality is seen. Status post cholecystectomy. No biliary dilatation. Pancreas: Unremarkable. No pancreatic ductal dilatation or surrounding inflammatory changes. Spleen: Normal in size without focal abnormality. Adrenals/Urinary Tract: Adrenal glands are unremarkable. Kidneys are normal, without renal calculi, focal lesion, or hydronephrosis. Bladder is unremarkable. Stomach/Bowel: There is sigmoid colon diverticulosis. There is wall thickening of the mid sigmoid colon with surrounding inflammation worrisome for acute diverticulitis. No evidence for perforation or  abscess. No bowel obstruction. Appendix is likely surgically absent. There are postsurgical changes in the stomach and proximal small bowel compatible with gastric bypass. No evidence for bowel obstruction. Vascular/Lymphatic: No significant vascular findings are present. No enlarged abdominal or pelvic lymph nodes. Reproductive: Uterus is surgically absent. The left ovary appears prominent in size and there is surrounding inflammation. Right ovary and uterus are not seen. Other: Trace free fluid in the pelvis. No abdominal wall hernia. There are coarse calcifications in the right breast with some scarring. There surgical clips in the right breast. Musculoskeletal: No acute or significant osseous findings. IMPRESSION: 1. Acute sigmoid colon diverticulitis. No evidence for perforation or abscess. 2. The left ovary appears prominent in size and there is surrounding inflammation. Recommend further evaluation with pelvic ultrasound. 3. Trace free fluid in the pelvis. Electronically Signed: By: ARonney AstersM.D. On: 08/31/2022 20:29   [place]PELVIC COMPLETE WITH TRANSVAGINAL  Result Date: 08/31/2022 CLINICAL DATA:  lower abdominal pain, abnormal CT EXAM: TRANSABDOMINAL AND TRANSVAGINAL ULTRASOUND OF PELVIS TECHNIQUE: Both transabdominal and transvaginal ultrasound examinations of the pelvis were performed. Transabdominal technique was performed for global imaging of the pelvis including uterus, ovaries, adnexal regions, and pelvic cul-de-sac. It was necessary to proceed with endovaginal exam following the transabdominal exam to visualize the ovaries. COMPARISON:  CT today FINDINGS: Uterus Measurements: Prior hysterectomy Endometrium Thickness: Prior hysterectomy. Right ovary Measurements: Not visualized.  No adnexal mass seen. Left ovary Measurements: Not visualized.  No adnexal mass seen. Other findings No abnormal free fluid. IMPRESSION: Neither ovary could be visualized.  No adnexal mass seen. Prior hysterectomy.  Electronically Signed   By: KRolm BaptiseM.D.   On: 08/31/2022 23:16    Microbiology: No results found for this or any previous visit.  Labs: CBC: Recent Labs  Lab 08/31/22 1700 09/01/22 0516 09/02/22 0450 09/03/22 0345 09/04/22 0418  WBC 11.8* 9.8 8.9 7.0 6.0  HGB 12.4 11.2* 10.4* 10.7* 10.5*  HCT 37.3 34.1* 31.6* 32.3* 32.6*  MCV 94.4 95.8 95.5 94.7 97.3  PLT 300 258 246 292 257   Basic Metabolic Panel: Recent Labs  Lab 08/31/22 1700 09/01/22 0516 09/02/22 0450 09/03/22 0345 09/04/22 0418  NA 138 137 135 141 142  K 3.9 3.5 3.4* 3.9 3.8  CL 101 101 104 111 113*  CO2 '27 28 22 24 24  '$ GLUCOSE 111* 112* 101* 111* 89  BUN '14 19 13 10 8  '$ CREATININE 0.84 0.96 0.85 0.89 0.78  CALCIUM 9.0 8.4* 8.0* 8.6* 8.4*   Liver Function Tests: Recent Labs  Lab 08/31/22 1700  AST 17  ALT 22  ALKPHOS 53  BILITOT 1.0  PROT 7.3  ALBUMIN 4.0    Discharge time spent: 35 minutes.  Signed: Cordelia Poche, MD [facility] 09/05/2022

## 2022-09-08 DIAGNOSIS — N7092 Oophoritis, unspecified: Secondary | ICD-10-CM

## 2022-09-08 NOTE — Assessment & Plan Note (Signed)
Left ovary. Noted on CT imaging and likely related to acute sigmoid diverticulitis. Recommendation for pelvic ultrasound in 4-6 weeks.

## 2022-09-08 NOTE — Hospital Course (Signed)
Jessica Hammond is a 61 y.o. female with a history of hypertension, hyperlipidemia and GERD. The patient was admitted due to acute left lower quadrant and suprapubic abdominal pain, diagnosed with acute sigmoid diverticulitis via CT abdomen/pelvis without perforation or abscess. Treatment involved IV fluids, Ceftriaxone, and Flagyl, resulting in symptom improvement. A follow-up CT scan showed decreased inflammation, allowing successful advancement to a soft diet. Discharge included a transition to Ciprofloxacin and Flagyl to complete a 10-day antibiotic course.

## 2022-09-24 IMAGING — MG MM DIGITAL SCREENING BILAT W/ TOMO AND CAD
8 series · 8 of 24 positions shown · non-contrast
Comparison: Previous exam(s).

ACR Breast Density Category a: The breast tissue is almost entirely
fatty.

CLINICAL DATA: Screening.

EXAM:
DIGITAL SCREENING BILATERAL MAMMOGRAM WITH TOMOSYNTHESIS AND CAD
TECHNIQUE: Bilateral screening digital craniocaudal and mediolateral oblique
mammograms were obtained. Bilateral screening digital breast
tomosynthesis was performed. The images were evaluated with
computer-aided detection.

[R MLO synth-2D]
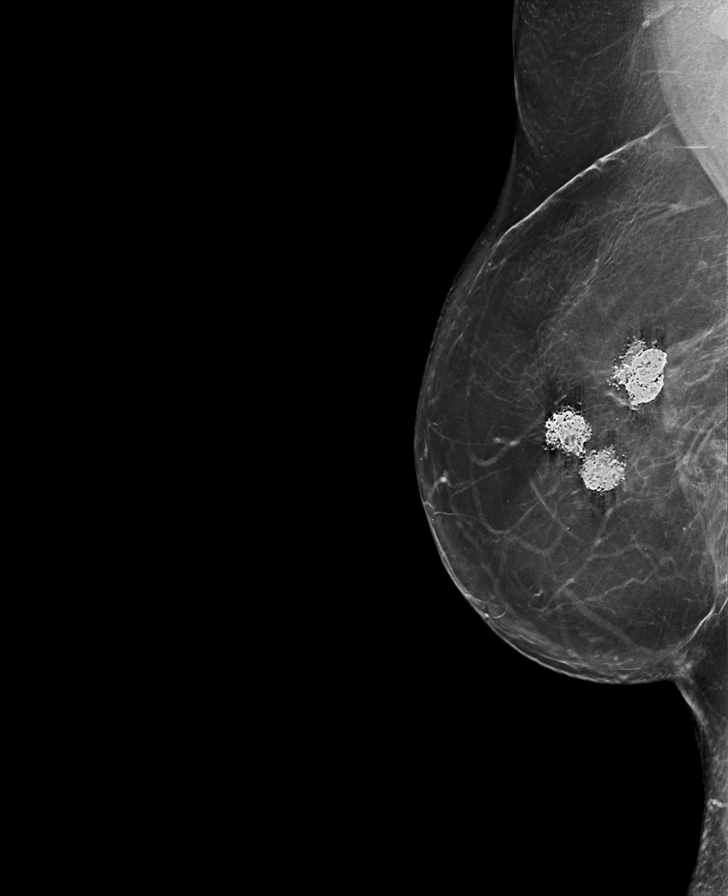

[L MLO synth-2D]
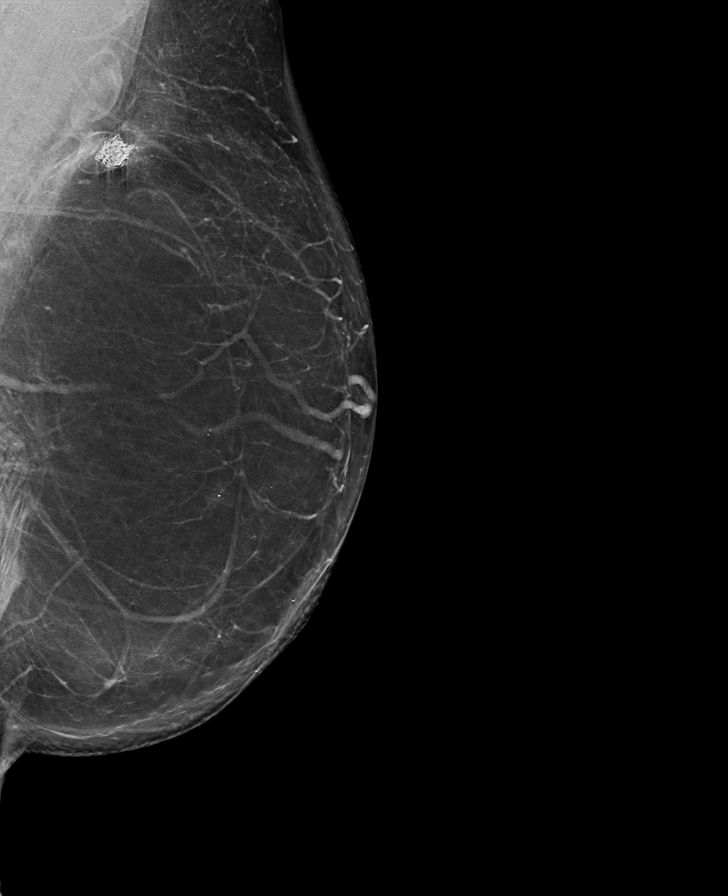

[L CC synth-2D]
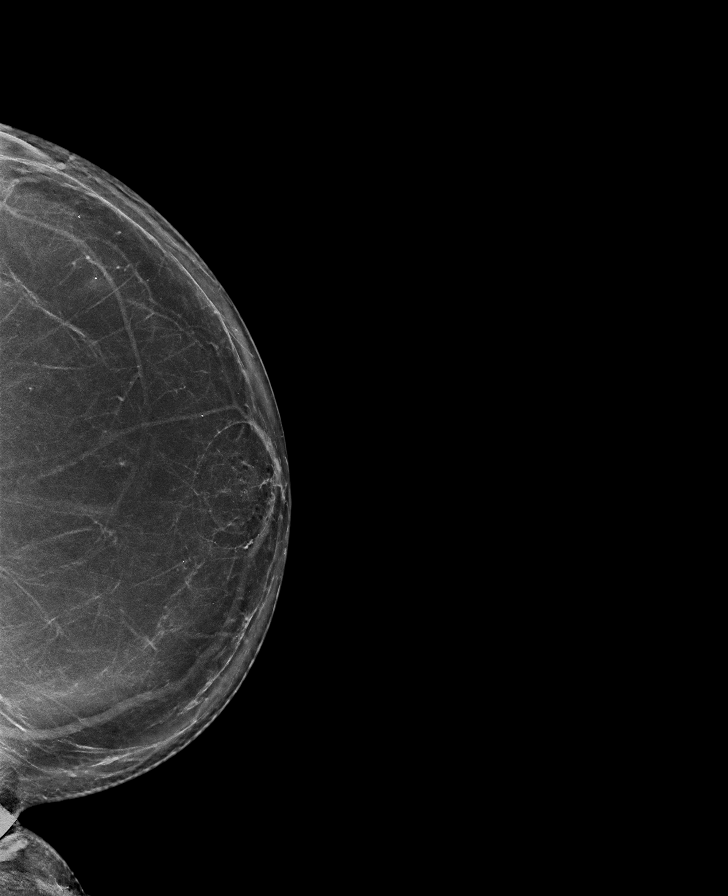

[R CC synth-2D]
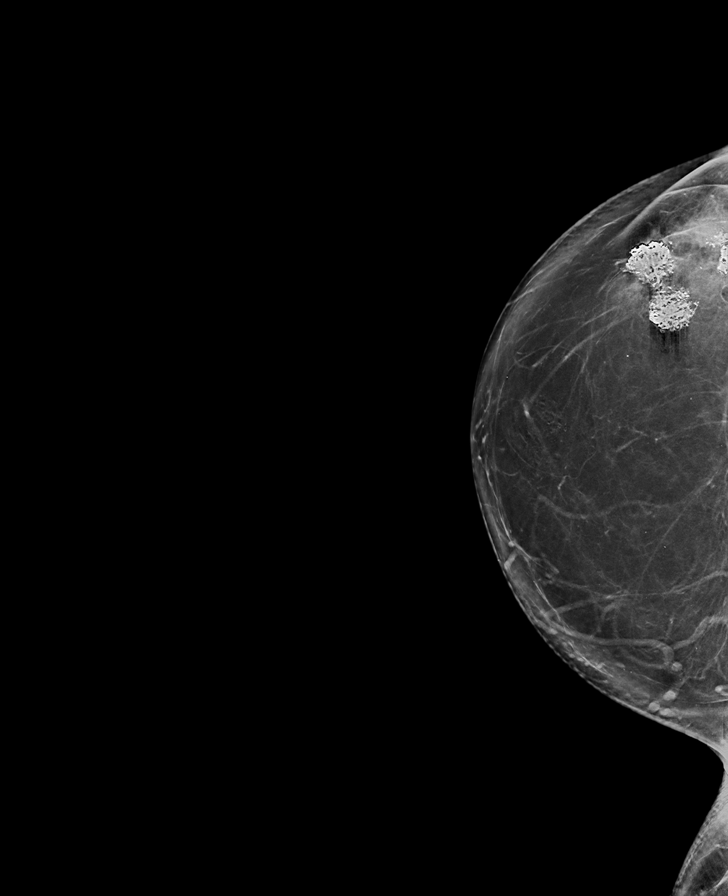

[R MLO tomo · tomo slice 47/93.0]
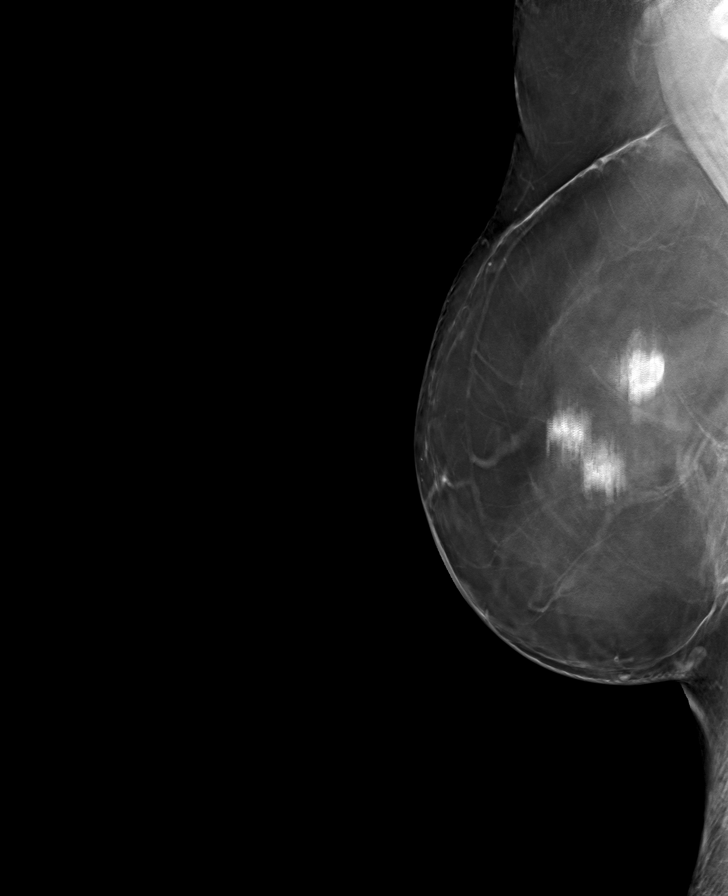

[L CC tomo · tomo slice 47/92.0]
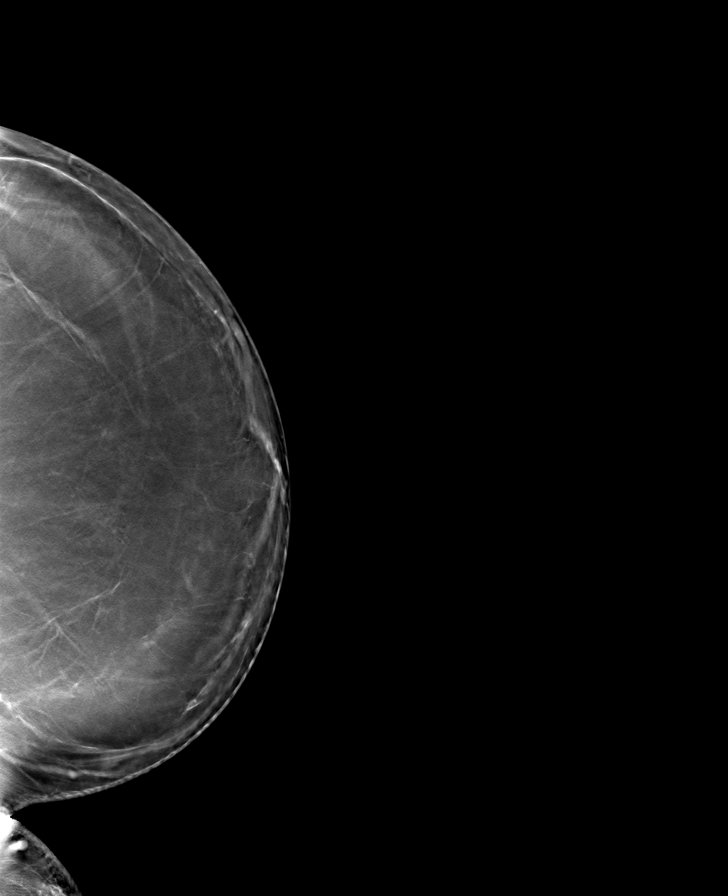

[R CC tomo · tomo slice 45/88.0]
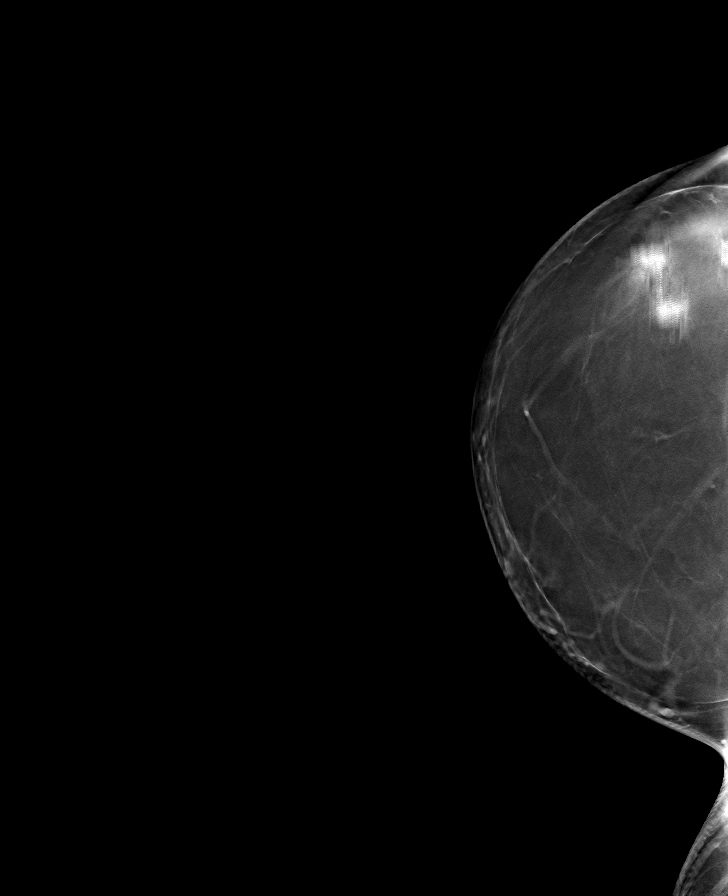

[L MLO tomo · tomo slice 47/93.0]
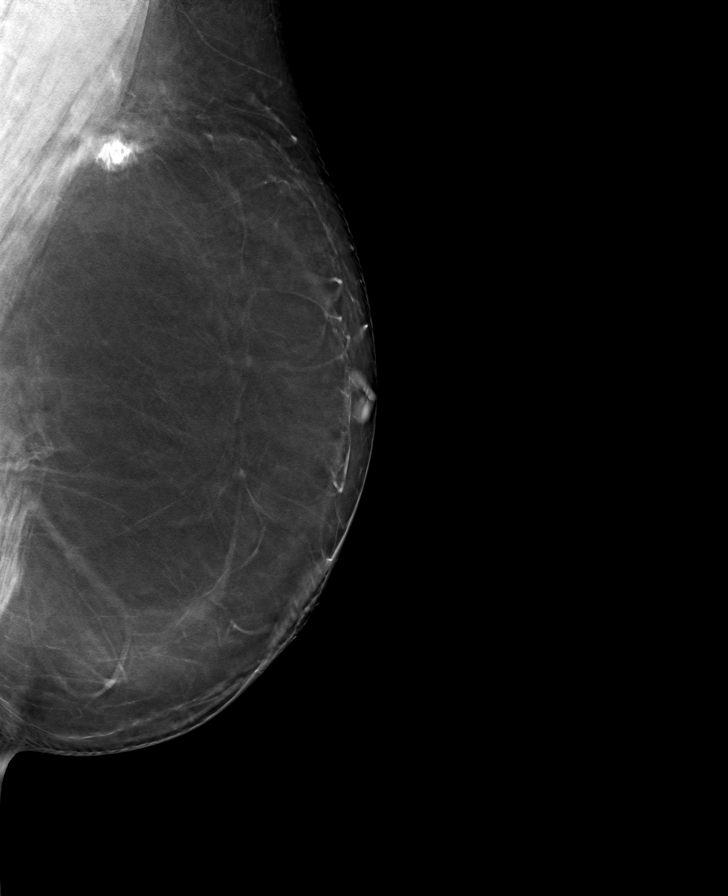

[8 of 24 positions shown; findings below may reference images not displayed]

FINDINGS: There are no findings suspicious for malignancy.
IMPRESSION: No mammographic evidence of malignancy. A result letter of this
screening mammogram will be mailed directly to the patient.

RECOMMENDATION:
Screening mammogram in one year. (Code:0E-3-N98)

BI-RADS CATEGORY  1: Negative.

## 2022-09-26 ENCOUNTER — Other Ambulatory Visit: Payer: Self-pay | Admitting: *Deleted

## 2022-09-26 DIAGNOSIS — E538 Deficiency of other specified B group vitamins: Secondary | ICD-10-CM

## 2022-09-26 DIAGNOSIS — D509 Iron deficiency anemia, unspecified: Secondary | ICD-10-CM

## 2022-09-27 ENCOUNTER — Inpatient Hospital Stay: Payer: BC Managed Care – PPO | Attending: Oncology

## 2022-09-27 DIAGNOSIS — D509 Iron deficiency anemia, unspecified: Secondary | ICD-10-CM

## 2022-09-27 DIAGNOSIS — Z853 Personal history of malignant neoplasm of breast: Secondary | ICD-10-CM | POA: Diagnosis not present

## 2022-09-27 DIAGNOSIS — Z9884 Bariatric surgery status: Secondary | ICD-10-CM | POA: Insufficient documentation

## 2022-09-27 DIAGNOSIS — D508 Other iron deficiency anemias: Secondary | ICD-10-CM | POA: Insufficient documentation

## 2022-09-27 DIAGNOSIS — E538 Deficiency of other specified B group vitamins: Secondary | ICD-10-CM

## 2022-09-27 DIAGNOSIS — Z9012 Acquired absence of left breast and nipple: Secondary | ICD-10-CM | POA: Diagnosis not present

## 2022-09-27 LAB — CBC WITH DIFFERENTIAL/PLATELET
Abs Immature Granulocytes: 0.01 10*3/uL (ref 0.00–0.07)
Basophils Absolute: 0.1 10*3/uL (ref 0.0–0.1)
Basophils Relative: 1 %
Eosinophils Absolute: 0.1 10*3/uL (ref 0.0–0.5)
Eosinophils Relative: 2 %
HCT: 36.4 % (ref 36.0–46.0)
Hemoglobin: 12.3 g/dL (ref 12.0–15.0)
Immature Granulocytes: 0 %
Lymphocytes Relative: 35 %
Lymphs Abs: 1.7 10*3/uL (ref 0.7–4.0)
MCH: 31.4 pg (ref 26.0–34.0)
MCHC: 33.8 g/dL (ref 30.0–36.0)
MCV: 92.9 fL (ref 80.0–100.0)
Monocytes Absolute: 0.5 10*3/uL (ref 0.1–1.0)
Monocytes Relative: 10 %
Neutro Abs: 2.5 10*3/uL (ref 1.7–7.7)
Neutrophils Relative %: 52 %
Platelets: 261 10*3/uL (ref 150–400)
RBC: 3.92 MIL/uL (ref 3.87–5.11)
RDW: 12.2 % (ref 11.5–15.5)
WBC: 4.9 10*3/uL (ref 4.0–10.5)
nRBC: 0 % (ref 0.0–0.2)

## 2022-09-27 LAB — IRON AND TIBC
Iron: 97 ug/dL (ref 28–170)
Saturation Ratios: 30 % (ref 10.4–31.8)
TIBC: 319 ug/dL (ref 250–450)
UIBC: 222 ug/dL

## 2022-09-27 LAB — FERRITIN: Ferritin: 102 ng/mL (ref 11–307)

## 2022-09-27 LAB — VITAMIN B12: Vitamin B-12: 5390 pg/mL — ABNORMAL HIGH (ref 180–914)

## 2022-11-22 NOTE — H&P (Signed)
Pre-Procedure H&P   Patient ID: Jessica Hammond is a 62 y.o. female.  Gastroenterology Provider: Annamaria Helling, DO  Referring Provider: Laurine Blazer, PA PCP: Rusty Aus, MD  Date: 11/23/2022  HPI Jessica Hammond is a 62 y.o. female who presents today for Colonoscopy for Diverticulitis follow-up .  Patient undergoing colonoscopy for follow-up of diverticulitis.  Most recent episode November which resulted in hospitalization and antibiotics with improvement.  CT at that time demonstrated diverticulitis in the sigmoid  Reports regular bowel movements most days without melena or hematochezia  Status post hysterectomy and cholecystectomy. S/p roux en y gastric bypass (2014)  Underwent colonoscopy in 2017 demonstrated melanosis  Most recent lab work ferritin 102 B12 5400 TIBC 319 iron saturation 30% hemoglobin 12.3 MCV 93 platelets 261,000  Son-Crohn's disease; mother-ulcerative colitis; mother-colon polyps; maternal grandmother-IBD; maternal aunt-colorectal and gastric cancer  Past Medical History:  Diagnosis Date   Cancer ([place]) 09/22/2008   rt breast   GERD (gastroesophageal reflux disease)    Hyperlipidemia    Hypertension    Pterygium eye, right    Seasonal allergies     Past Surgical History:  Procedure Laterality Date   ABDOMINAL HYSTERECTOMY     still has cervix   AUGMENTATION MAMMAPLASTY  02/03/2009   tranflap   BREAST EXCISIONAL BIOPSY Right 09/22/2008   lumpectomy +   CHOLECYSTECTOMY     COLONOSCOPY  2013   COLONOSCOPY WITH PROPOFOL N/A 08/03/2016   Procedure: COLONOSCOPY WITH PROPOFOL;  Surgeon: [name], MD;  Location: [facility] ENDOSCOPY;  Service: Endoscopy;  Laterality: N/A;   ESOPHAGOGASTRODUODENOSCOPY (EGD) WITH PROPOFOL N/A 08/03/2016   Procedure: ESOPHAGOGASTRODUODENOSCOPY (EGD) WITH PROPOFOL;  Surgeon: [name], MD;  Location: [facility] ENDOSCOPY;  Service: Endoscopy;  Laterality: N/A;   GIVENS CAPSULE STUDY N/A 08/14/2016   Procedure: GIVENS  CAPSULE STUDY;  Surgeon: [name], MD;  Location: [facility] ENDOSCOPY;  Service: Gastroenterology;  Laterality: N/A;   INGUINAL HERNIA REPAIR Right    LAPAROSCOPIC CHOLECYSTECTOMY W/ CHOLANGIOGRAPHY  1999   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    sleep apnea surgery  2003    Family History Son-Crohn's disease; mother-ulcerative colitis; mother-colon polyps; maternal grandmother-IBD; maternal aunt-colorectal and gastric cancer No h/o GI disease or malignancy  Review of Systems  Constitutional:  Negative for activity change, appetite change, chills, diaphoresis, fatigue, fever and unexpected weight change.  HENT:  Negative for trouble swallowing and voice change.   Respiratory:  Negative for shortness of breath and wheezing.   Cardiovascular:  Negative for chest pain, palpitations and leg swelling.  Gastrointestinal:  Positive for abdominal pain (resolved). Negative for abdominal distention, anal bleeding, blood in stool, constipation, diarrhea, nausea, rectal pain and vomiting.  Musculoskeletal:  Negative for arthralgias and myalgias.  Skin:  Negative for color change and pallor.  Neurological:  Negative for dizziness, syncope and weakness.  Psychiatric/Behavioral:  Negative for confusion.   All other systems reviewed and are negative.    Medications No current facility-administered medications on file prior to encounter.   Current Outpatient Medications on File Prior to Encounter  Medication Sig Dispense Refill   escitalopram (LEXAPRO) 10 MG tablet Take 10 mg by mouth daily.     lisinopril-hydrochlorothiazide (PRINZIDE,ZESTORETIC) 10-12.5 MG tablet Take by mouth daily.      rosuvastatin (CRESTOR) 10 MG tablet Take 10 mg by mouth daily.     BIOTIN  PO Take by mouth.     Calcium Carbonate-Vitamin D (CALCIUM-VITAMIN D3 PO) Take 1 tablet by mouth daily at 12 noon. 600-400      Cholecalciferol (VITAMIN D3) 25 MCG (1000 UT) CAPS Vitamin D3 25 mcg (1,000 unit) capsule  Take 1 capsule every day by oral route.     Cyanocobalamin (VITAMIN B-12) 2500 MCG SUBL Place 1 tablet under the tongue daily at 12 noon.     ferrous sulfate 324 MG TBEC Take 324 mg by mouth daily with breakfast.     Multiple Vitamin (MULTI-VITAMINS) TABS Take 1 tablet by mouth daily at 12 noon.     Omega-3 Fatty Acids (FISH OIL) 1000 MG CAPS Take 1 capsule by mouth daily at 12 noon.     VITAMIN E PO Take 400 Units by mouth daily at 12 noon.      Pertinent medications related to GI and procedure were reviewed by me with the patient prior to the procedure   Current Facility-Administered Medications:    0.9 %  sodium chloride infusion, , Intravenous, Continuous, Annamaria Helling, DO      No Known Allergies Allergies were reviewed by me prior to the procedure  Objective   Body mass index is 31.91 kg/m. Vitals:   11/23/22 0959  BP: 131/84  Pulse: 74  Resp: 18  Temp: (!) 96.7 F (35.9 C)  TempSrc: Temporal  SpO2: 99%  Weight: 71.7 kg  Height: '4\' 11"'$  (1.499 m)     Physical Exam Vitals and nursing note reviewed.  Constitutional:      General: She is not in acute distress.    Appearance: Normal appearance. She is obese. She is not ill-appearing, toxic-appearing or diaphoretic.  HENT:     Head: Normocephalic and atraumatic.     Nose: Nose normal.     Mouth/Throat:     Mouth: Mucous membranes are moist.     Pharynx: Oropharynx is clear.  Eyes:     General: No scleral icterus.    Extraocular Movements: Extraocular movements intact.  Cardiovascular:     Rate and Rhythm: Normal rate and regular rhythm.     Heart sounds: Normal heart sounds. No murmur heard.    No friction rub. No gallop.  Pulmonary:     Effort: Pulmonary effort is normal. No respiratory distress.     Breath sounds: Normal breath sounds. No wheezing, rhonchi or rales.  Abdominal:     General: Bowel sounds  are normal. There is no distension.     Palpations: Abdomen is soft.     Tenderness: There is no abdominal tenderness. There is no guarding or rebound.  Musculoskeletal:     Cervical back: Neck supple.     Right lower leg: No edema.     Left lower leg: No edema.  Skin:    General: Skin is warm and dry.     Coloration: Skin is not jaundiced or pale.  Neurological:     General: No focal deficit present.     Mental Status: She is alert and oriented to person, place, and time. Mental status is at baseline.  Psychiatric:        Mood and Affect: Mood normal.        Behavior: Behavior normal.        Thought Content: Thought content normal.        Judgment: Judgment normal.      Assessment:  Jessica Hammond is a 62 y.o. female  who presents today  for Colonoscopy for Diverticulitis follow-up .  Plan:  Colonoscopy with possible intervention today  Colonoscopy with possible biopsy, control of bleeding, polypectomy, and interventions as necessary has been discussed with the patient/patient representative. Informed consent was obtained from the patient/patient representative after explaining the indication, nature, and risks of the procedure including but not limited to death, bleeding, perforation, missed neoplasm/lesions, cardiorespiratory compromise, and reaction to medications. Opportunity for questions was given and appropriate answers were provided. Patient/patient representative has verbalized understanding is amenable to undergoing the procedure.   Annamaria Helling, DO  [facility]enter[facility]  Portions of the record may have been created with voice recognition software. Occasional wrong-word or 'sound-a-like' substitutions may have occurred due to the inherent limitations of voice recognition software.  Read the chart carefully and recognize, using context, where substitutions may have occurred.

## 2022-11-23 ENCOUNTER — Ambulatory Visit
Admission: RE | Admit: 2022-11-23 | Discharge: 2022-11-23 | Disposition: A | Discharge status: Home / Self Care | Payer: BC Managed Care – PPO | Source: Ambulatory Visit | Attending: Gastroenterology | Admitting: Gastroenterology

## 2022-11-23 ENCOUNTER — Encounter: Payer: Self-pay | Admitting: Gastroenterology

## 2022-11-23 ENCOUNTER — Ambulatory Visit: Payer: BC Managed Care – PPO | Admitting: Anesthesiology

## 2022-11-23 ENCOUNTER — Encounter
Admission: RE | Disposition: A | Discharge status: Home / Self Care | Payer: Self-pay | Source: Ambulatory Visit | Attending: Gastroenterology

## 2022-11-23 DIAGNOSIS — Z83719 Family history of colon polyps, unspecified: Secondary | ICD-10-CM | POA: Diagnosis not present

## 2022-11-23 DIAGNOSIS — K621 Rectal polyp: Secondary | ICD-10-CM | POA: Insufficient documentation

## 2022-11-23 DIAGNOSIS — K640 First degree hemorrhoids: Secondary | ICD-10-CM | POA: Diagnosis not present

## 2022-11-23 DIAGNOSIS — Z9884 Bariatric surgery status: Secondary | ICD-10-CM | POA: Diagnosis not present

## 2022-11-23 DIAGNOSIS — K6389 Other specified diseases of intestine: Secondary | ICD-10-CM | POA: Diagnosis not present

## 2022-11-23 DIAGNOSIS — Z9049 Acquired absence of other specified parts of digestive tract: Secondary | ICD-10-CM | POA: Insufficient documentation

## 2022-11-23 DIAGNOSIS — Z8379 Family history of other diseases of the digestive system: Secondary | ICD-10-CM | POA: Insufficient documentation

## 2022-11-23 DIAGNOSIS — F32A Depression, unspecified: Secondary | ICD-10-CM | POA: Insufficient documentation

## 2022-11-23 DIAGNOSIS — Z90710 Acquired absence of both cervix and uterus: Secondary | ICD-10-CM | POA: Insufficient documentation

## 2022-11-23 DIAGNOSIS — Z09 Encounter for follow-up examination after completed treatment for conditions other than malignant neoplasm: Secondary | ICD-10-CM | POA: Insufficient documentation

## 2022-11-23 DIAGNOSIS — K5730 Diverticulosis of large intestine without perforation or abscess without bleeding: Secondary | ICD-10-CM | POA: Insufficient documentation

## 2022-11-23 DIAGNOSIS — K5792 Diverticulitis of intestine, part unspecified, without perforation or abscess without bleeding: Secondary | ICD-10-CM | POA: Diagnosis present

## 2022-11-23 DIAGNOSIS — G4730 Sleep apnea, unspecified: Secondary | ICD-10-CM | POA: Diagnosis not present

## 2022-11-23 DIAGNOSIS — K6289 Other specified diseases of anus and rectum: Secondary | ICD-10-CM | POA: Diagnosis not present

## 2022-11-23 DIAGNOSIS — I10 Essential (primary) hypertension: Secondary | ICD-10-CM | POA: Diagnosis not present

## 2022-11-23 DIAGNOSIS — D123 Benign neoplasm of transverse colon: Secondary | ICD-10-CM | POA: Insufficient documentation

## 2022-11-23 DIAGNOSIS — Z79899 Other long term (current) drug therapy: Secondary | ICD-10-CM | POA: Diagnosis not present

## 2022-11-23 HISTORY — PX: COLONOSCOPY WITH PROPOFOL: SHX5780

## 2022-11-23 SURGERY — COLONOSCOPY WITH PROPOFOL
Anesthesia: General

## 2022-11-23 MED ORDER — PROPOFOL 500 MG/50ML IV EMUL
INTRAVENOUS | Status: DC | PRN
  Administered 2022-11-23: 150 ug/kg/min via INTRAVENOUS

## 2022-11-23 MED ORDER — EPHEDRINE SULFATE (PRESSORS) 50 MG/ML IJ SOLN
INTRAMUSCULAR | Status: DC | PRN
  Administered 2022-11-23: 10 mg via INTRAVENOUS

## 2022-11-23 MED ORDER — PROPOFOL 10 MG/ML IV BOLUS
INTRAVENOUS | Status: DC | PRN
  Administered 2022-11-23: 60 mg via INTRAVENOUS
  Administered 2022-11-23: 30 mg via INTRAVENOUS

## 2022-11-23 MED ORDER — LIDOCAINE HCL (PF) 2 % IJ SOLN
INTRAMUSCULAR | Status: AC
  Filled 2022-11-23: qty 5

## 2022-11-23 MED ORDER — PHENYLEPHRINE 80 MCG/ML (10ML) SYRINGE FOR IV PUSH (FOR BLOOD PRESSURE SUPPORT)
PREFILLED_SYRINGE | INTRAVENOUS | Status: DC | PRN
  Administered 2022-11-23 (×2): 80 ug via INTRAVENOUS

## 2022-11-23 MED ORDER — SODIUM CHLORIDE 0.9 % IV SOLN: INTRAVENOUS | Status: DC

## 2022-11-23 NOTE — Anesthesia Preprocedure Evaluation (Addendum)
Anesthesia Evaluation  Patient identified by MRN, date of birth, ID band Patient awake    Reviewed: Allergy & Precautions, NPO status , Patient's Chart, lab work & pertinent test results  Airway Mallampati: III  TM Distance: >3 FB Neck ROM: full    Dental  (+) Chipped Irregular alignment of teeth:   Pulmonary sleep apnea  Pt does not use CPAP. Advised to use tonight after procedure due to TIVA/OSA interaction   Pulmonary exam normal        Cardiovascular hypertension, Normal cardiovascular exam     Neuro/Psych  PSYCHIATRIC DISORDERS  Depression    negative neurological ROS     GI/Hepatic Neg liver ROS,GERD  Medicated and Controlled,,  Endo/Other  negative endocrine ROS    Renal/GU negative Renal ROS  negative genitourinary   Musculoskeletal   Abdominal   Peds  Hematology  (+) Blood dyscrasia, anemia   Anesthesia Other Findings Past Medical History: 09/22/2008: Cancer ([place])     Comment:  rt breast No date: GERD (gastroesophageal reflux disease) No date: Hyperlipidemia No date: Hypertension No date: Pterygium eye, right No date: Seasonal allergies  Past Surgical History: No date: ABDOMINAL HYSTERECTOMY     Comment:  still has cervix 02/03/2009: AUGMENTATION MAMMAPLASTY     Comment:  tranflap 09/22/2008: BREAST EXCISIONAL BIOPSY; Right     Comment:  lumpectomy + No date: CHOLECYSTECTOMY 2013: COLONOSCOPY 08/03/2016: COLONOSCOPY WITH PROPOFOL; N/A     Comment:  Procedure: COLONOSCOPY WITH PROPOFOL;  Surgeon: [name], MD;  Location: [facility] ENDOSCOPY;  Service: Endoscopy;              Laterality: N/A; 08/03/2016: ESOPHAGOGASTRODUODENOSCOPY (EGD) WITH PROPOFOL; N/A     Comment:  Procedure: ESOPHAGOGASTRODUODENOSCOPY (EGD) WITH               PROPOFOL;  Surgeon: [name], MD;  Location: [facility]               ENDOSCOPY;  Service: Endoscopy;  Laterality: N/A; 08/14/2016: GIVENS CAPSULE STUDY; N/A      Comment:  Procedure: GIVENS CAPSULE STUDY;  Surgeon: [name],               MD;  Location: [facility] ENDOSCOPY;  Service:               Gastroenterology;  Laterality: N/A; No date: INGUINAL HERNIA REPAIR; Right 1999: LAPAROSCOPIC CHOLECYSTECTOMY W/ CHOLANGIOGRAPHY 08/17/2013: LAPAROSCOPIC GASTRIC BYPASS 02/03/2009: MASTECTOMY; Bilateral     Comment:  bilat mast with tramflap recon., there is still breast               tissue, no nipple sparring.  2003: sleep apnea surgery     Reproductive/Obstetrics negative OB ROS                             Anesthesia Physical Anesthesia Plan  ASA: 2  Anesthesia Plan: General   Post-op Pain Management:    Induction: Intravenous  PONV Risk Score and Plan: Propofol infusion and TIVA  Airway Management Planned: Natural Airway and Nasal Cannula  Additional Equipment:   Intra-op Plan:   Post-operative Plan:   Informed Consent: I have reviewed the patients History and Physical, chart, labs and discussed the procedure including the risks, benefits and alternatives for the proposed anesthesia with the patient or authorized representative who has indicated his/her understanding and acceptance.  Dental Advisory Given  Plan Discussed with: Anesthesiologist, CRNA and Surgeon  Anesthesia Plan Comments: (Patient consented for risks of anesthesia including but not limited to:  - adverse reactions to medications - risk of airway placement if required - damage to eyes, teeth, lips or other oral mucosa - nerve damage due to positioning  - sore throat or hoarseness - Damage to heart, brain, nerves, lungs, other parts of body or loss of life  Patient voiced understanding.)        Anesthesia Quick Evaluation

## 2022-11-23 NOTE — Interval H&P Note (Signed)
History and Physical Interval Note: Preprocedure H&P from 11/23/22  was reviewed and there was no interval change after seeing and examining the patient.  Written consent was obtained from the patient after discussion of risks, benefits, and alternatives. Patient has consented to proceed with Colonoscopy with possible intervention   11/23/2022 10:13 AM  Jessica Hammond  has presented today for surgery, with the diagnosis of Diverticulitis.  The various methods of treatment have been discussed with the patient and family. After consideration of risks, benefits and other options for treatment, the patient has consented to  Procedure(s): COLONOSCOPY WITH PROPOFOL (N/A) as a surgical intervention.  The patient's history has been reviewed, patient examined, no change in status, stable for surgery.  I have reviewed the patient's chart and labs.  Questions were answered to the patient's satisfaction.     Annamaria Helling

## 2022-11-23 NOTE — Transfer of Care (Signed)
Immediate Anesthesia Transfer of Care Note  Patient: Jessica Hammond  Procedure(s) Performed: COLONOSCOPY WITH PROPOFOL  Patient Location: PACU  Anesthesia Type:General  Level of Consciousness: awake and alert   Airway & Oxygen Therapy: Patient Spontanous Breathing  Post-op Assessment: Report given to RN and Post -op Vital signs reviewed and stable  Post vital signs: Reviewed and stable  Last Vitals:  Vitals Value Taken Time  BP 137/81 11/23/22 1106  Temp 36.4 C 11/23/22 1057  Pulse 72 11/23/22 1115  Resp 17 11/23/22 1115  SpO2 98 % 11/23/22 1115  Vitals shown include unvalidated device data.  Last Pain:  Vitals:   11/23/22 1106  TempSrc:   PainSc: 0-No pain         Complications: No notable events documented.

## 2022-11-23 NOTE — Op Note (Signed)
[facility] Gastroenterology Patient Name: Jessica Hammond Procedure Date: 11/23/2022 10:19 AM MRN: [id number] Account #: [id number] Date of Birth: 1961/03/14 Admit Type: Outpatient Age: 62 Room: [facility] ENDO ROOM 1 Gender: Female Note Status: Finalized Instrument Name: Peds Colonoscope [id number] Procedure:             Colonoscopy Indications:           Follow-up of diverticulitis Providers:             Annamaria Helling DO, DO Referring MD:          Rusty Aus, MD (Referring MD) Medicines:             Monitored Anesthesia Care Complications:         No immediate complications. Estimated blood loss:                         Minimal. Procedure:             Pre-Anesthesia Assessment:                        - Prior to the procedure, a History and Physical was                         performed, and patient medications and allergies were                         reviewed. The patient is competent. The risks and                         benefits of the procedure and the sedation options and                         risks were discussed with the patient. All questions                         were answered and informed consent was obtained.                         Patient identification and proposed procedure were                         verified by the physician, the nurse, the anesthetist                         and the technician in the endoscopy suite. Mental                         Status Examination: alert and oriented. Airway                         Examination: normal oropharyngeal airway and neck                         mobility. Respiratory Examination: clear to                         auscultation. CV Examination: RRR, no murmurs, no S3  or S4. Prophylactic Antibiotics: The patient does not                         require prophylactic antibiotics. Prior                         Anticoagulants: The patient has taken no anticoagulant                          or antiplatelet agents. ASA Grade Assessment: III - A                         patient with severe systemic disease. After reviewing                         the risks and benefits, the patient was deemed in                         satisfactory condition to undergo the procedure. The                         anesthesia plan was to use monitored anesthesia care                         (MAC). Immediately prior to administration of                         medications, the patient was re-assessed for adequacy                         to receive sedatives. The heart rate, respiratory                         rate, oxygen saturations, blood pressure, adequacy of                         pulmonary ventilation, and response to care were                         monitored throughout the procedure. The physical                         status of the patient was re-assessed after the                         procedure.                        After obtaining informed consent, the colonoscope was                         passed under direct vision. Throughout the procedure,                         the patient's blood pressure, pulse, and oxygen                         saturations were monitored continuously. The  Colonoscope was introduced through the anus and                         advanced to the the terminal ileum, with                         identification of the appendiceal orifice and IC                         valve. The colonoscopy was performed without                         difficulty. The patient tolerated the procedure well.                         The quality of the bowel preparation was evaluated                         using the BBPS [facility] Bowel Preparation Scale) with                         scores of: Right Colon = 2 (minor amount of residual                         staining, small fragments of stool and/or opaque                         liquid, but mucosa  seen well), Transverse Colon = 3                         (entire mucosa seen well with no residual staining,                         small fragments of stool or opaque liquid) and Left                         Colon = 3 (entire mucosa seen well with no residual                         staining, small fragments of stool or opaque liquid).                         The total BBPS score equals 8. The quality of the                         bowel preparation was excellent. The terminal ileum,                         ileocecal valve, appendiceal orifice, and rectum were                         photographed. Findings:      The perianal and digital rectal examinations were normal. Pertinent       negatives include normal sphincter tone.      The terminal ileum appeared normal. Estimated blood loss: none.      Retroflexion in the right colon was performed.  Two sessile polyps were found in the rectum and transverse colon. The       polyps were 1 to 2 mm in size. These polyps were removed with a jumbo       cold forceps. Resection and retrieval were complete. Estimated blood       loss was minimal.      Scattered small-mouthed diverticula were found in the left colon.       Estimated blood loss: none.      Non-bleeding internal hemorrhoids were found during retroflexion. The       hemorrhoids were Grade I (internal hemorrhoids that do not prolapse).       Estimated blood loss: none.      Anal papilla(e) were hypertrophied. Estimated blood loss: none.      A diffuse area of mild melanosis was found in the ascending colon and in       the cecum. Estimated blood loss: none.      The exam was otherwise without abnormality on direct and retroflexion       views. Impression:            - The examined portion of the ileum was normal.                        - Two 1 to 2 mm polyps in the rectum and in the                         transverse colon, removed with a jumbo cold forceps.                          Resected and retrieved.                        - Diverticulosis in the left colon.                        - Non-bleeding internal hemorrhoids.                        - Anal papilla(e) were hypertrophied.                        - Melanosis in the colon.                        - The examination was otherwise normal on direct and                         retroflexion views. Recommendation:        - Patient has a contact number available for                         emergencies. The signs and symptoms of potential                         delayed complications were discussed with the patient.                         Return to normal activities tomorrow. Written  discharge instructions were provided to the patient.                        - Discharge patient to home.                        - Resume previous diet.                        - Continue present medications.                        - Await pathology results.                        - Repeat colonoscopy for surveillance based on                         pathology results.                        - Return to GI office as previously scheduled.                        - The findings and recommendations were discussed with                         the patient. Procedure Code(s):     --- Professional ---                        [phone], Colonoscopy, flexible; with biopsy, single or                         multiple Diagnosis Code(s):     --- Professional ---                        K64.0, First degree hemorrhoids                        D12.8, Benign neoplasm of rectum                        D12.3, Benign neoplasm of transverse colon (hepatic                         flexure or splenic flexure)                        K62.89, Other specified diseases of anus and rectum                        K63.89, Other specified diseases of intestine                        K57.32, Diverticulitis of large intestine without                          perforation or abscess without bleeding                        K57.30, Diverticulosis of large intestine without  perforation or abscess without bleeding CPT copyright 2022 [employer]. All rights reserved. The codes documented in this report are preliminary and upon coder review may  be revised to meet current compliance requirements. Attending Participation:      I personally performed the entire procedure. Volney American, DO Annamaria Helling DO, DO 11/23/2022 11:00:27 AM This report has been signed electronically. Number of Addenda: 0 Note Initiated On: 11/23/2022 10:19 AM Scope Withdrawal Time: 0 hours 15 minutes 44 seconds  Total Procedure Duration: 0 hours 26 minutes 31 seconds  Estimated Blood Loss:  Estimated blood loss was minimal.      [facility]

## 2022-11-23 NOTE — Anesthesia Postprocedure Evaluation (Signed)
Anesthesia Post Note  Patient: Jessica Hammond  Procedure(s) Performed: COLONOSCOPY WITH PROPOFOL  Patient location during evaluation: Endoscopy Anesthesia Type: General Level of consciousness: awake and alert Pain management: pain level controlled Vital Signs Assessment: post-procedure vital signs reviewed and stable Respiratory status: spontaneous breathing, nonlabored ventilation and respiratory function stable Cardiovascular status: blood pressure returned to baseline and stable Postop Assessment: no apparent nausea or vomiting Anesthetic complications: no   No notable events documented.   Last Vitals:  Vitals:   11/23/22 1106 11/23/22 1116  BP: 137/81 137/76  Pulse: 74 72  Resp: 19 17  Temp:    SpO2: 100% 98%    Last Pain:  Vitals:   11/23/22 1116  TempSrc:   PainSc: 0-No pain                 Alphonsus Sias

## 2022-11-26 ENCOUNTER — Encounter: Payer: Self-pay | Admitting: Gastroenterology

## 2022-11-26 LAB — SURGICAL PATHOLOGY

## 2023-03-26 ENCOUNTER — Other Ambulatory Visit: Payer: Self-pay

## 2023-03-26 DIAGNOSIS — D509 Iron deficiency anemia, unspecified: Secondary | ICD-10-CM

## 2023-03-27 ENCOUNTER — Encounter: Payer: Self-pay | Admitting: Medical Oncology

## 2023-03-27 ENCOUNTER — Inpatient Hospital Stay (HOSPITAL_BASED_OUTPATIENT_CLINIC_OR_DEPARTMENT_OTHER): Payer: BC Managed Care – PPO | Admitting: Medical Oncology

## 2023-03-27 ENCOUNTER — Inpatient Hospital Stay: Payer: BC Managed Care – PPO | Attending: Medical Oncology

## 2023-03-27 VITALS — BP 106/61 | HR 63 | Temp 98.0°F | Wt 165.0 lb

## 2023-03-27 DIAGNOSIS — Z9884 Bariatric surgery status: Secondary | ICD-10-CM | POA: Insufficient documentation

## 2023-03-27 DIAGNOSIS — D508 Other iron deficiency anemias: Secondary | ICD-10-CM

## 2023-03-27 DIAGNOSIS — Z08 Encounter for follow-up examination after completed treatment for malignant neoplasm: Secondary | ICD-10-CM

## 2023-03-27 DIAGNOSIS — Z853 Personal history of malignant neoplasm of breast: Secondary | ICD-10-CM | POA: Insufficient documentation

## 2023-03-27 DIAGNOSIS — D509 Iron deficiency anemia, unspecified: Secondary | ICD-10-CM

## 2023-03-27 DIAGNOSIS — Z79899 Other long term (current) drug therapy: Secondary | ICD-10-CM | POA: Insufficient documentation

## 2023-03-27 DIAGNOSIS — Z86000 Personal history of in-situ neoplasm of breast: Secondary | ICD-10-CM | POA: Diagnosis not present

## 2023-03-27 LAB — CBC WITH DIFFERENTIAL (CANCER CENTER ONLY)
Abs Immature Granulocytes: 0.02 10*3/uL (ref 0.00–0.07)
Basophils Absolute: 0 10*3/uL (ref 0.0–0.1)
Basophils Relative: 1 %
Eosinophils Absolute: 0.1 10*3/uL (ref 0.0–0.5)
Eosinophils Relative: 2 %
HCT: 36.6 % (ref 36.0–46.0)
Hemoglobin: 12.3 g/dL (ref 12.0–15.0)
Immature Granulocytes: 0 %
Lymphocytes Relative: 31 %
Lymphs Abs: 1.8 10*3/uL (ref 0.7–4.0)
MCH: 31.9 pg (ref 26.0–34.0)
MCHC: 33.6 g/dL (ref 30.0–36.0)
MCV: 94.8 fL (ref 80.0–100.0)
Monocytes Absolute: 0.5 10*3/uL (ref 0.1–1.0)
Monocytes Relative: 8 %
Neutro Abs: 3.5 10*3/uL (ref 1.7–7.7)
Neutrophils Relative %: 58 %
Platelet Count: 280 10*3/uL (ref 150–400)
RBC: 3.86 MIL/uL — ABNORMAL LOW (ref 3.87–5.11)
RDW: 11.9 % (ref 11.5–15.5)
WBC Count: 6 10*3/uL (ref 4.0–10.5)
nRBC: 0 % (ref 0.0–0.2)

## 2023-03-27 LAB — VITAMIN B12: Vitamin B-12: 3526 pg/mL — ABNORMAL HIGH (ref 180–914)

## 2023-03-27 LAB — IRON AND TIBC
Iron: 42 ug/dL (ref 28–170)
Saturation Ratios: 12 % (ref 10.4–31.8)
TIBC: 349 ug/dL (ref 250–450)
UIBC: 307 ug/dL

## 2023-03-27 LAB — FERRITIN: Ferritin: 197 ng/mL (ref 11–307)

## 2023-03-27 NOTE — Progress Notes (Signed)
Hematology/Oncology Consult note [facility]  Telephone:([phone] [phone]  Patient Care Team: Danella Penton, MD as PCP - General (Internal Medicine) Rosey Bath, MD (Inactive) as PCP - Hematology/Oncology (Hematology and Oncology) Rosey Bath, MD (Inactive) as Consulting Physician (Hematology and Oncology)   Name of the patient: Jessica Hammond  [id number]  02-Feb-1961   Date of visit: 03/27/23  Diagnosis- history of iron deficiency anemia s/p gastric bypass  Stage I right breast cancer in 2010 s/p bilateral mastectomy with TRAM flap reconstruction.  Chief complaint/ Reason for visit-also visit for breast cancer and routine follow-up of iron deficiency  Heme/Onc history: Patient is a 62 year old female diagnosed with stage I right breast cancer in January 2010.  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  T1 a M0.  ER/PR positive HER2 negative.  She is s/p lumpectomy and sentinel lymph node biopsy in January 2010.  She then underwent a bilateral mastectomy with TRAM flap reconstruction in May 2010 for positive margins.  She initially took tamoxifen and was then switched to Aromasin which was ultimately discontinued in October 2015.  She still gets bilateral mammograms every year as per her plastic surgeon's recommendation given that there was residual breast tissue around the TRAM flap.   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  Colonscopy on 08/03/2016 was normal except for 3 mm sessile polyp in the cecum. Pathology revealed melanosis coli negative for dysplasia or malignancy.  Capsule endoscopy revealed no abnormality.    Interval history- Patient reports that she has been doing well other than her chronic fatigue. This is stable and not worsened. She denies any bleeding or  bruising episodes including hemoptysis, hematuria, melena, epistaxis. She continues to take her B12 and iron supplementation.   She also denies any unintentional weight loss, night sweats, breast changes or concerns. She is due for her upcoming mammogram in July.   Wt Readings from Last 3 Encounters:  03/27/23 165 lb (74.8 kg)  11/23/22 158 lb (71.7 kg)  08/31/22 166 lb (75.3 kg)     ECOG PS- 0 Pain scale- 0   Review of systems- Review of Systems  Constitutional:  Positive for malaise/fatigue. Negative for chills, fever and weight loss.  HENT:  Negative for congestion, ear discharge and nosebleeds.   Eyes:  Negative for blurred vision.  Respiratory:  Negative for cough, hemoptysis, sputum production, shortness of breath and wheezing.   Cardiovascular:  Negative for chest pain, palpitations, orthopnea and claudication.  Gastrointestinal:  Negative for abdominal pain, blood in stool, constipation, diarrhea, heartburn, melena, nausea and vomiting.  Genitourinary:  Negative for dysuria, flank pain, frequency, hematuria and urgency.  Musculoskeletal:  Negative for back pain, joint pain and myalgias.  Skin:  Negative for rash.  Neurological:  Negative for dizziness, tingling, focal weakness, seizures, weakness and headaches.  Endo/Heme/Allergies:  Does not bruise/bleed easily.  Psychiatric/Behavioral:  Negative for depression and suicidal ideas. The patient does not have insomnia.     No Known Allergies   Past Medical History:  Diagnosis Date   Cancer (HCC) 09/22/2008   rt breast   GERD (gastroesophageal reflux disease)    Hyperlipidemia    Hypertension    Pterygium eye, right    Seasonal allergies      Past Surgical History:  Procedure Laterality Date   ABDOMINAL HYSTERECTOMY  still has cervix   AUGMENTATION MAMMAPLASTY  02/03/2009   tranflap   BREAST EXCISIONAL BIOPSY Right 09/22/2008   lumpectomy +   CHOLECYSTECTOMY     COLONOSCOPY  2013   COLONOSCOPY WITH PROPOFOL  N/A 08/03/2016   Procedure: COLONOSCOPY WITH PROPOFOL;  Surgeon: Wyline Mood, MD;  Location: [facility] ENDOSCOPY;  Service: Endoscopy;  Laterality: N/A;   COLONOSCOPY WITH PROPOFOL N/A 11/23/2022   Procedure: COLONOSCOPY WITH PROPOFOL;  Surgeon: Jaynie Collins, DO;  Location: [facility] ENDOSCOPY;  Service: Gastroenterology;  Laterality: N/A;   ESOPHAGOGASTRODUODENOSCOPY (EGD) WITH PROPOFOL N/A 08/03/2016   Procedure: ESOPHAGOGASTRODUODENOSCOPY (EGD) WITH PROPOFOL;  Surgeon: Wyline Mood, MD;  Location: [facility] ENDOSCOPY;  Service: Endoscopy;  Laterality: N/A;   GIVENS CAPSULE STUDY N/A 08/14/2016   Procedure: GIVENS CAPSULE STUDY;  Surgeon: Wyline Mood, MD;  Location: [facility] ENDOSCOPY;  Service: Gastroenterology;  Laterality: N/A;   INGUINAL HERNIA REPAIR Right    LAPAROSCOPIC CHOLECYSTECTOMY W/ CHOLANGIOGRAPHY  1999   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    sleep apnea surgery  2003    Social History   Socioeconomic History   Marital status: Married    Spouse name: Not on file   Number of children: Not on file   Years of education: Not on file   Highest education level: Not on file  Occupational History   Not on file  Tobacco Use   Smoking status: Never   Smokeless tobacco: Never  Substance and Sexual Activity   Alcohol use: Yes    Alcohol/week: 0.0 standard drinks of alcohol    Comment: occasional consumption   Drug use: No   Sexual activity: Not on file  Other Topics Concern   Not on file  Social History Narrative   Not on file   Social Determinants of Health   Financial Resource Strain: Not on file  Food Insecurity: No Food Insecurity (09/05/2022)   Hunger Vital Sign    Worried About Running Out of Food in the Last Year: Never true    Ran Out of Food in the Last Year: Never true  Transportation Needs: No Transportation Needs (09/05/2022)   PRAPARE - Therapist, art (Medical): No    Lack of Transportation (Non-Medical): No  Physical Activity: Not on file  Stress: Not on file  Social Connections: Not on file  Intimate Partner Violence: Not At Risk (09/05/2022)   Humiliation, Afraid, Rape, and Kick questionnaire    Fear of Current or Ex-Partner: No    Emotionally Abused: No    Physically Abused: No    Sexually Abused: No    Family History  Problem Relation Age of Onset   Breast cancer Mother 64   Breast cancer Maternal Aunt 69       twin sister to mother     Current Outpatient Medications:    BIOTIN PO, Take by mouth., Disp: , Rfl:    Calcium Carbonate-Vitamin D (CALCIUM-VITAMIN D3 PO), Take 1 tablet by mouth daily at 12 noon. 600-400, Disp: , Rfl:    Cholecalciferol (VITAMIN D3) 25 MCG (1000 UT) CAPS, Vitamin D3 25 mcg (1,000 unit) capsule  Take 1 capsule every day by oral route., Disp: , Rfl:    Cyanocobalamin (VITAMIN B-12) 2500 MCG SUBL, Place 1 tablet under the tongue daily at 12 noon., Disp: , Rfl:    escitalopram (LEXAPRO) 10 MG tablet, Take 10  mg by mouth daily., Disp: , Rfl:    ferrous sulfate 324 MG TBEC, Take 324 mg by mouth daily with breakfast., Disp: , Rfl:    Multiple Vitamin (MULTI-VITAMINS) TABS, Take 1 tablet by mouth daily at 12 noon., Disp: , Rfl:    Omega-3 Fatty Acids (FISH OIL) 1000 MG CAPS, Take 1 capsule by mouth daily at 12 noon., Disp: , Rfl:    rosuvastatin (CRESTOR) 10 MG tablet, Take 10 mg by mouth daily., Disp: , Rfl:    VITAMIN E PO, Take 400 Units by mouth daily at 12 noon., Disp: , Rfl:    lisinopril-hydrochlorothiazide (PRINZIDE,ZESTORETIC) 10-12.5 MG tablet, Take by mouth daily. , Disp: , Rfl:   Physical exam:  Vitals:   03/27/23 1310  BP: 106/61  Pulse: 63  Temp: 98 F (36.7 C)  TempSrc: Tympanic  SpO2: 98%  Weight: 165 lb (74.8 kg)   Physical Exam Constitutional:      General: She is not in acute distress. Cardiovascular:     Rate and Rhythm: Normal rate and regular  rhythm.     Heart sounds: Normal heart sounds.  Pulmonary:     Effort: Pulmonary effort is normal.     Breath sounds: Normal breath sounds.  Skin:    General: Skin is warm and dry.  Neurological:     Mental Status: She is alert and oriented to person, place, and time.   Breast exam: Patient is s/p bilateral mastectomy with TRAM flap construction.  No palpable masses in either breast.  No palpable bilateral axillary adenopathy     Latest Ref Rng & Units 09/04/2022    4:18 AM  CMP  Glucose 70 - 99 mg/dL 89   BUN 8 - 23 mg/dL 8   Creatinine 3.08 - 6.57 mg/dL 8.46   Sodium 962 - 952 mmol/L 142   Potassium 3.5 - 5.1 mmol/L 3.8   Chloride 98 - 111 mmol/L 113   CO2 22 - 32 mmol/L 24   Calcium 8.9 - 10.3 mg/dL 8.4       Latest Ref Rng & Units 03/27/2023    1:00 PM  CBC  WBC 4.0 - 10.5 K/uL 6.0   Hemoglobin 12.0 - 15.0 g/dL 84.1   Hematocrit 32.4 - 46.0 % 36.6   Platelets 150 - 400 K/uL 280    Assessment and plan- Patient is a 62 y.o. female who is here for follow-up of following issues  Surveillance breast cancer:She is now over 13 years from the diagnosis of her breast cancer.  Clinically patient is doing well with no concerning signs and symptoms of recurrence based on today's exam.  Mammogram order placed and discussed with patient who is agreeable.   Iron deficiency anemia: S/p gastric bypass patient is not presently anemic with an H&H of 12.3/36.6. Iron and B12 studies pending. We will see her back in 1 year with labs.    Visit Diagnosis 1. Encounter for follow-up surveillance of breast cancer   2. 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 H/O malignant neoplasm of breast   4. Iron deficiency anemia secondary to inadequate dietary iron intake     Jannetta Quint [facility] [phone] 03/27/2023 2:14 PM

## 2023-04-25 ENCOUNTER — Ambulatory Visit
Admission: RE | Admit: 2023-04-25 | Discharge: 2023-04-25 | Disposition: A | Discharge status: Home / Self Care | Payer: BC Managed Care – PPO | Source: Ambulatory Visit | Attending: Medical Oncology | Admitting: Medical Oncology

## 2023-04-25 DIAGNOSIS — Z1231 Encounter for screening mammogram for malignant neoplasm of breast: Secondary | ICD-10-CM | POA: Diagnosis not present

## 2023-04-25 DIAGNOSIS — Z08 Encounter for follow-up examination after completed treatment for malignant neoplasm: Secondary | ICD-10-CM

## 2023-04-25 DIAGNOSIS — Z853 Personal history of malignant neoplasm of breast: Secondary | ICD-10-CM | POA: Diagnosis not present

## 2023-04-25 DIAGNOSIS — Z86000 Personal history of in-situ neoplasm of breast: Secondary | ICD-10-CM | POA: Insufficient documentation

## 2023-04-26 ENCOUNTER — Other Ambulatory Visit: Payer: Self-pay | Admitting: Medical Oncology

## 2024-03-24 ENCOUNTER — Inpatient Hospital Stay: Payer: BC Managed Care – PPO | Admitting: Oncology

## 2024-03-24 ENCOUNTER — Other Ambulatory Visit: Payer: Self-pay

## 2024-03-24 ENCOUNTER — Inpatient Hospital Stay: Payer: BC Managed Care – PPO | Attending: Oncology

## 2024-03-24 VITALS — BP 122/79 | HR 65 | Temp 98.3°F | Resp 18 | Ht <= 58 in | Wt 160.6 lb

## 2024-03-24 DIAGNOSIS — Z853 Personal history of malignant neoplasm of breast: Secondary | ICD-10-CM

## 2024-03-24 DIAGNOSIS — Z9013 Acquired absence of bilateral breasts and nipples: Secondary | ICD-10-CM | POA: Insufficient documentation

## 2024-03-24 DIAGNOSIS — Z08 Encounter for follow-up examination after completed treatment for malignant neoplasm: Secondary | ICD-10-CM

## 2024-03-24 DIAGNOSIS — Z9884 Bariatric surgery status: Secondary | ICD-10-CM | POA: Diagnosis not present

## 2024-03-24 DIAGNOSIS — E538 Deficiency of other specified B group vitamins: Secondary | ICD-10-CM

## 2024-03-24 DIAGNOSIS — Z86000 Personal history of in-situ neoplasm of breast: Secondary | ICD-10-CM

## 2024-03-24 DIAGNOSIS — D509 Iron deficiency anemia, unspecified: Secondary | ICD-10-CM | POA: Diagnosis present

## 2024-03-24 LAB — CBC WITH DIFFERENTIAL/PLATELET
Abs Immature Granulocytes: 0.01 10*3/uL (ref 0.00–0.07)
Basophils Absolute: 0 10*3/uL (ref 0.0–0.1)
Basophils Relative: 1 %
Eosinophils Absolute: 0.2 10*3/uL (ref 0.0–0.5)
Eosinophils Relative: 3 %
HCT: 34 % — ABNORMAL LOW (ref 36.0–46.0)
Hemoglobin: 11.2 g/dL — ABNORMAL LOW (ref 12.0–15.0)
Immature Granulocytes: 0 %
Lymphocytes Relative: 27 %
Lymphs Abs: 1.5 10*3/uL (ref 0.7–4.0)
MCH: 31.7 pg (ref 26.0–34.0)
MCHC: 32.9 g/dL (ref 30.0–36.0)
MCV: 96.3 fL (ref 80.0–100.0)
Monocytes Absolute: 0.6 10*3/uL (ref 0.1–1.0)
Monocytes Relative: 10 %
Neutro Abs: 3.2 10*3/uL (ref 1.7–7.7)
Neutrophils Relative %: 59 %
Platelets: 269 10*3/uL (ref 150–400)
RBC: 3.53 MIL/uL — ABNORMAL LOW (ref 3.87–5.11)
RDW: 12.2 % (ref 11.5–15.5)
WBC: 5.4 10*3/uL (ref 4.0–10.5)
nRBC: 0 % (ref 0.0–0.2)

## 2024-03-24 LAB — VITAMIN B12: Vitamin B-12: 3147 pg/mL — ABNORMAL HIGH (ref 180–914)

## 2024-03-24 LAB — IRON AND TIBC
Iron: 91 ug/dL (ref 28–170)
Saturation Ratios: 24 % (ref 10.4–31.8)
TIBC: 372 ug/dL (ref 250–450)
UIBC: 281 ug/dL

## 2024-03-24 LAB — FERRITIN: Ferritin: 52 ng/mL (ref 11–307)

## 2024-03-27 ENCOUNTER — Encounter: Payer: Self-pay | Admitting: Oncology

## 2024-03-27 NOTE — Progress Notes (Signed)
 Hematology/Oncology Consult note [facility]  Telephone:([phone] [phone]  Patient Care Team: Cleotilde Oneil FALCON, MD as PCP - General (Internal Medicine) Rudell Eleanor BROCKS, MD (Inactive) as PCP - Hematology/Oncology (Hematology and Oncology) Rudell Eleanor BROCKS, MD (Inactive) as Consulting Physician (Hematology and Oncology)   Name of the patient: Jessica Hammond  [id number]  04/22/1961   Date of visit: 03/27/24  Diagnosis-history of iron  deficiency anemia status post gastric bypass  Stage I right breast cancer in 2010 s/p bilateral mastectomy with TRAM flap reconstruction.   Chief complaint/ Reason for visit-routine follow-up of anemia and surveillance visit for breast cancer  Heme/Onc history:  Patient is a 63 year old female diagnosed with stage I right breast cancer in January 2010.  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  T1 a M0.  ER/PR positive HER2 negative.  She is s/p lumpectomy and sentinel lymph node biopsy in January 2010.  She then underwent a bilateral mastectomy with TRAM flap reconstruction in May 2010 for positive margins.  She initially took tamoxifen and was then switched to Aromasin which was ultimately discontinued in October 2015.  She still gets bilateral mammograms every year as per her plastic surgeon's recommendation given that there was residual breast tissue around the TRAM flap.   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  Colonscopy on 08/03/2016 was normal except for 3 mm sessile polyp in the cecum. Pathology revealed melanosis coli negative for dysplasia or malignancy.  Capsule endoscopy revealed no abnormality.  Interval history-she is doing well overall and has not had any recent infections or hospitalizations.  Denies any breast concerns.  Mild baseline  fatigue  ECOG PS- 1 Pain scale- 0   Review of systems- Review of Systems  Constitutional:  Positive for malaise/fatigue. Negative for chills, fever and weight loss.  HENT:  Negative for congestion, ear discharge and nosebleeds.   Eyes:  Negative for blurred vision.  Respiratory:  Negative for cough, hemoptysis, sputum production, shortness of breath and wheezing.   Cardiovascular:  Negative for chest pain, palpitations, orthopnea and claudication.  Gastrointestinal:  Negative for abdominal pain, blood in stool, constipation, diarrhea, heartburn, melena, nausea and vomiting.  Genitourinary:  Negative for dysuria, flank pain, frequency, hematuria and urgency.  Musculoskeletal:  Negative for back pain, joint pain and myalgias.  Skin:  Negative for rash.  Neurological:  Negative for dizziness, tingling, focal weakness, seizures, weakness and headaches.  Endo/Heme/Allergies:  Does not bruise/bleed easily.  Psychiatric/Behavioral:  Negative for depression and suicidal ideas. The patient does not have insomnia.       No Known Allergies   Past Medical History:  Diagnosis Date   Cancer (HCC) 09/22/2008   rt breast   GERD (gastroesophageal reflux disease)    Hyperlipidemia    Hypertension    Pterygium eye, right    Seasonal allergies      Past Surgical History:  Procedure Laterality Date   ABDOMINAL HYSTERECTOMY     still has cervix   AUGMENTATION MAMMAPLASTY  02/03/2009   tranflap   BREAST EXCISIONAL BIOPSY Right 09/22/2008   lumpectomy +   CHOLECYSTECTOMY     COLONOSCOPY  2013   COLONOSCOPY WITH PROPOFOL  N/A 08/03/2016   Procedure: COLONOSCOPY WITH PROPOFOL ;  Surgeon: Ruel Kung, MD;  Location: [facility] ENDOSCOPY;  Service: Endoscopy;  Laterality: N/A;   COLONOSCOPY WITH PROPOFOL  N/A 11/23/2022  Procedure: COLONOSCOPY WITH PROPOFOL ;  Surgeon: Onita Elspeth Sharper, DO;  Location: [facility] ENDOSCOPY;  Service: Gastroenterology;  Laterality: N/A;   ESOPHAGOGASTRODUODENOSCOPY (EGD) WITH  PROPOFOL  N/A 08/03/2016   Procedure: ESOPHAGOGASTRODUODENOSCOPY (EGD) WITH PROPOFOL ;  Surgeon: Ruel Kung, MD;  Location: [facility] ENDOSCOPY;  Service: Endoscopy;  Laterality: N/A;   GIVENS CAPSULE STUDY N/A 08/14/2016   Procedure: GIVENS CAPSULE STUDY;  Surgeon: Ruel Kung, MD;  Location: [facility] ENDOSCOPY;  Service: Gastroenterology;  Laterality: N/A;   INGUINAL HERNIA REPAIR Right    LAPAROSCOPIC CHOLECYSTECTOMY W/ CHOLANGIOGRAPHY  1999   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    sleep apnea surgery  2003    Social History   Socioeconomic History   Marital status: Married    Spouse name: Not on file   Number of children: Not on file   Years of education: Not on file   Highest education level: Not on file  Occupational History   Not on file  Tobacco Use   Smoking status: Never   Smokeless tobacco: Never  Substance and Sexual Activity   Alcohol use: Yes    Alcohol/week: 0.0 standard drinks of alcohol    Comment: occasional consumption   Drug use: No   Sexual activity: Not on file  Other Topics Concern   Not on file  Social History Narrative   Not on file   Social Drivers of Health   Financial Resource Strain: Low Risk  (03/11/2023)   Received from [facility]   Overall Financial Resource Strain (CARDIA)    Difficulty of Paying Living Expenses: Not hard at all  Food Insecurity: No Food Insecurity (03/24/2024)   Hunger Vital Sign    Worried About Running Out of Food in the Last Year: Never true    Ran Out of Food in the Last Year: Never true  Transportation Needs: No Transportation Needs (03/24/2024)   PRAPARE - Administrator, Civil Service (Medical): No    Lack of Transportation (Non-Medical): No  Physical Activity: Not on file  Stress: Not on file  Social Connections: Not on file  Intimate Partner Violence: Not At Risk  (03/24/2024)   Humiliation, Afraid, Rape, and Kick questionnaire    Fear of Current or Ex-Partner: No    Emotionally Abused: No    Physically Abused: No    Sexually Abused: No    Family History  Problem Relation Age of Onset   Breast cancer Mother 65   Breast cancer Maternal Aunt 73       twin sister to mother     Current Outpatient Medications:    BIOTIN PO, Take by mouth., Disp: , Rfl:    Cholecalciferol (VITAMIN D3) 25 MCG (1000 UT) CAPS, Vitamin D3 25 mcg (1,000 unit) capsule  Take 1 capsule every day by oral route., Disp: , Rfl:    Cyanocobalamin  (VITAMIN B-12) 2500 MCG SUBL, Place 1 tablet under the tongue daily at 12 noon., Disp: , Rfl:    escitalopram  (LEXAPRO ) 10 MG tablet, Take 10 mg by mouth daily., Disp: , Rfl:    lisinopril -hydrochlorothiazide  (PRINZIDE ,ZESTORETIC ) 10-12.5 MG tablet, Take by mouth daily. , Disp: , Rfl:    Multiple Vitamin (MULTI-VITAMINS) TABS, Take 1 tablet by mouth daily at 12 noon., Disp: , Rfl:    Omega-3 Fatty Acids (FISH OIL) 1000 MG CAPS, Take 1 capsule by mouth daily at 12 noon., Disp: ,  Rfl:    rosuvastatin  (CRESTOR ) 10 MG tablet, Take 10 mg by mouth daily., Disp: , Rfl:    VITAMIN E PO, Take 400 Units by mouth daily at 12 noon., Disp: , Rfl:    Calcium  Carbonate-Vitamin D (CALCIUM -VITAMIN D3 PO), Take 1 tablet by mouth daily at 12 noon. 600-400 (Patient not taking: Reported on 03/24/2024), Disp: , Rfl:    ferrous sulfate  324 MG TBEC, Take 324 mg by mouth daily with breakfast. (Patient not taking: Reported on 03/24/2024), Disp: , Rfl:   Physical exam:  Vitals:   03/24/24 1354  BP: 122/79  Pulse: 65  Resp: 18  Temp: 98.3 F (36.8 C)  TempSrc: Tympanic  SpO2: 98%  Weight: 160 lb 9.6 oz (72.8 kg)  Height: 4' 10 (1.473 m)   Physical Exam  Eyes:     Pupils: Pupils are equal, round, and reactive to light.    Cardiovascular:     Rate and Rhythm: Normal rate and regular rhythm.     Heart sounds: Normal heart sounds.  Pulmonary:      Effort: Pulmonary effort is normal.     Breath sounds: Normal breath sounds.  Abdominal:     General: Bowel sounds are normal.   Skin:    General: Skin is warm and dry.   Neurological:     Mental Status: She is alert and oriented to person, place, and time.   Breast exam/chest wall exam: Patient is post bilateral mastectomy with TRAM flap reconstruction.  No palpable masses in either breast.  No palpable bilateral axillary adenopathy.    I have personally reviewed labs listed below:    Latest Ref Rng & Units 09/04/2022    4:18 AM  CMP  Glucose 70 - 99 mg/dL 89   BUN 8 - 23 mg/dL 8   Creatinine 9.55 - 8.99 mg/dL 9.21   Sodium 864 - 854 mmol/L 142   Potassium 3.5 - 5.1 mmol/L 3.8   Chloride 98 - 111 mmol/L 113   CO2 22 - 32 mmol/L 24   Calcium  8.9 - 10.3 mg/dL 8.4       Latest Ref Rng & Units 03/24/2024    1:27 PM  CBC  WBC 4.0 - 10.5 K/uL 5.4   Hemoglobin 12.0 - 15.0 g/dL 88.7   Hematocrit 63.9 - 46.0 % 34.0   Platelets 150 - 400 K/uL 269       Assessment and plan- Patient is a 63 y.o. female who is here for follow-up of following issues:  Surveillance breast cancer: Patient is now 15 years out of her diagnosis of breast cancer.  She has bilateral TRAM flap reconstruction but undergoes yearly mammograms.  She will be getting her next mammogram in July 2025 and I will see her back in 1 year.  History of iron  deficiency anemia status post gastric bypass: H&H today 11.2/34 with an MCV of 96.3.  Ferritin levels are normal at 52 with an iron  saturation of 24%.  She does not require any IV iron  at this time.  B12 levels likely high due to ongoing supplementation.  Repeat labs in 1 year and see me   Visit Diagnosis 1. Encounter for follow-up surveillance of breast cancer   2. B12 deficiency   3. 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 Annah Skene, MD, MPH [facility] [phone] 03/27/2024 7:47 AM

## 2024-05-14 ENCOUNTER — Other Ambulatory Visit: Payer: Self-pay | Admitting: Oncology

## 2024-05-14 DIAGNOSIS — Z1231 Encounter for screening mammogram for malignant neoplasm of breast: Secondary | ICD-10-CM

## 2024-06-03 ENCOUNTER — Ambulatory Visit
Admission: RE | Admit: 2024-06-03 | Discharge: 2024-06-03 | Disposition: A | Discharge status: Home / Self Care | Source: Ambulatory Visit | Attending: Oncology | Admitting: Oncology

## 2024-06-03 DIAGNOSIS — Z1231 Encounter for screening mammogram for malignant neoplasm of breast: Secondary | ICD-10-CM | POA: Diagnosis present

## 2025-03-24 ENCOUNTER — Other Ambulatory Visit

## 2025-03-24 ENCOUNTER — Ambulatory Visit: Admitting: Oncology
